# Patient Record
Sex: Male | Born: 1960
Health system: Southern US, Community
[De-identification: ages and names within clinical notes are randomized; demographics above are authoritative.]

## PROBLEM LIST (undated history)

## (undated) DIAGNOSIS — I2510 Atherosclerotic heart disease of native coronary artery without angina pectoris: Secondary | ICD-10-CM

## (undated) DIAGNOSIS — E119 Type 2 diabetes mellitus without complications: Secondary | ICD-10-CM

## (undated) DIAGNOSIS — G709 Myoneural disorder, unspecified: Secondary | ICD-10-CM

## (undated) DIAGNOSIS — E785 Hyperlipidemia, unspecified: Secondary | ICD-10-CM

## (undated) DIAGNOSIS — J189 Pneumonia, unspecified organism: Secondary | ICD-10-CM

## (undated) DIAGNOSIS — I10 Essential (primary) hypertension: Secondary | ICD-10-CM

## (undated) HISTORY — PX: CLEFT PALATE REPAIR: SUR1165

## (undated) HISTORY — DX: Hyperlipidemia, unspecified: E78.5

## (undated) HISTORY — DX: Type 2 diabetes mellitus without complications: E11.9

## (undated) HISTORY — DX: Essential (primary) hypertension: I10

---

## 2015-09-13 ENCOUNTER — Encounter: Payer: Self-pay | Admitting: *Deleted

## 2015-09-13 ENCOUNTER — Encounter: Payer: BLUE CROSS/BLUE SHIELD | Attending: Internal Medicine | Admitting: *Deleted

## 2015-09-13 VITALS — Ht 70.0 in | Wt 174.0 lb

## 2015-09-13 DIAGNOSIS — E1065 Type 1 diabetes mellitus with hyperglycemia: Secondary | ICD-10-CM | POA: Diagnosis present

## 2015-09-13 DIAGNOSIS — IMO0002 Reserved for concepts with insufficient information to code with codable children: Secondary | ICD-10-CM

## 2015-09-13 DIAGNOSIS — E108 Type 1 diabetes mellitus with unspecified complications: Secondary | ICD-10-CM

## 2015-09-13 NOTE — Progress Notes (Signed)
Insulin Pump Evaluation Visit:  Date: 09/13/2015   Appt start time: 1400 end time:  1530.  Assessment:  This patient has DM 1 and their primary concerns today: moving from injections to insulin pump and CGM.   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[employer] and the schedule is variable  Last A1c was 7.6%  Patient states they forget to take their insulin injection on average 0 times per week Patient states they have had hypoglycemia 10-15 times in the past month Patient states their biggest barrier with diabetes is glycemic control  Progress Towards Obtaining an Insulin Pump Goal(s):   Patient states their expectations of pump therapy include: better glycemic control and greater flexibility with life Patient expresses understanding that for improved outcomes for their diabetes on an insulin pump they will:  During the first 1-2 weeks (or more) of new pump use, the patient will test BG pre and 2 hour post each meal, bedtime and 3 AM to assist the provider in evaluating accuracy of pump settings and to prevent DKA   Will test BG at least 4 times per day once evaluation time is completed  Change out pump infusion set at least every 3 days  Upload pump information to their pump's Web Based Program on a regular basis so provider can assess patterns and make setting adjustments.     Intervention:    Taught difference between delivery of insulin via syringe/pen compared to insulin pump.  Demonstrated improved insulin delivery via pump due to  improved accuracy of dose and flexibility of adjusting bolus insulin based on carb intake and BG correction.  Showed patient the following pumps:  Medtronic 630G per pateint request  Demonstrated pump, insulin reservoir and infusion set options, and button pushing for bolus delivery of insulin through the pump  Explained importance of testing BG at least 4 times per day for appropriate correction of high BG and prevention of DKA as applicable.  Emphasized importance of follow up after Pump Start for appropriate pump setting adjustments and on-going training on more advanced features.  Discussed current Continuous Glucose Monitoring options (if needed)  Handouts given during visit include:  Introduction to Pump Therapy Handout  DM 1 Support Group Flyer  Monitoring/Evaluation:    Patient does want to continue with pursuit of Medtronic 630 G  insulin pump and CGM.  We have faxed the AOB paperwork local Pump Company Rep so they can start the process of obtaining the pump. Contact information provided to the patient.  Patient instructed to go to that pump web-site to complete any learning module on insulin pump prior to next visit  Once pump is shipped, patient to call this office to set up training.

## 2015-09-13 NOTE — Patient Instructions (Signed)
Plan: You have decided to move forward with the Medtronic 630G pump and Continuous Glucose Sensor We will fax the AOB and other PPW to Roxanne, she will reach out to you shortly When your pump ships, call this office to set up pump start training, then we well schedule the CGM training after that

## 2015-09-17 NOTE — Progress Notes (Signed)
We also spent a few minutes reviewing Carb Counting including by Food Group. Patient comfortable with reading food labels and now expresses good understanding of identifying foods by food group and converting those servngs to grams of carbohydrate when labels are not available.

## 2015-10-10 ENCOUNTER — Encounter: Payer: BLUE CROSS/BLUE SHIELD | Attending: Internal Medicine | Admitting: *Deleted

## 2015-10-10 DIAGNOSIS — E1065 Type 1 diabetes mellitus with hyperglycemia: Secondary | ICD-10-CM | POA: Insufficient documentation

## 2015-10-10 DIAGNOSIS — E109 Type 1 diabetes mellitus without complications: Secondary | ICD-10-CM

## 2015-10-13 NOTE — Progress Notes (Signed)
Insulin Pump Start Progress Note on 10/10/15  Patient appointment start time: 1400  End time 1600  Patient here for insulin pump start on Medtronic 630G pump and Mio infusion set Orders with pump settings received from MD Patient completed Pre- training by training books, return demonstration  Reviewed Pump Set Up including  Menu Settings  Bolus Settings:    Insulin Carb Ratio of 1 unit / 12 grams     Insulin Sensitivity Factor of 1 unit / 40 mg/dl              Target: 161100 mg/dl  Suspend  Basal with initial Basal Rate of 1.0 units/hour  Reservoir Set Up  Utilities  Pump Training Checklist completed  Used Temp Basal of 9 hours duration @ 50 % basal due to patient taking their long acting insulin yesterday at 11 PM  Patient is aware to sign up for Web Based Software and agrees to upload within 3 days for review of progress and allow for pump setting adjustments.  Patient successfully completed pump start and instructed to call me if BG drops below 70 mg/dl or goes above 096300 mg/dl or as directed by MD  Follow up plan: 1 week follow up

## 2015-10-19 ENCOUNTER — Encounter: Payer: BLUE CROSS/BLUE SHIELD | Admitting: *Deleted

## 2015-10-23 DIAGNOSIS — E1042 Type 1 diabetes mellitus with diabetic polyneuropathy: Secondary | ICD-10-CM | POA: Diagnosis not present

## 2015-10-23 DIAGNOSIS — Z794 Long term (current) use of insulin: Secondary | ICD-10-CM | POA: Diagnosis not present

## 2015-10-23 DIAGNOSIS — E103511 Type 1 diabetes mellitus with proliferative diabetic retinopathy with macular edema, right eye: Secondary | ICD-10-CM | POA: Diagnosis not present

## 2015-10-25 ENCOUNTER — Encounter: Payer: BLUE CROSS/BLUE SHIELD | Admitting: *Deleted

## 2015-10-25 DIAGNOSIS — Z1211 Encounter for screening for malignant neoplasm of colon: Secondary | ICD-10-CM | POA: Diagnosis not present

## 2015-10-25 DIAGNOSIS — E109 Type 1 diabetes mellitus without complications: Secondary | ICD-10-CM

## 2015-11-10 NOTE — Progress Notes (Signed)
CGM Training:  Appt start time:1130 end time:1230.  Assessment:  Primary concerns today: Patient here for initiation of Medtronic Continuous Glucose Monitoring.   Medications: see list, new on insulin pump and very pleased with it     Intervention:   Understanding Glucose Sensing Programming Sensor Information  High Glucose: not set up but discussed  Low Glucose 80 mg/dl  Other settings to be added at follow up visit Starting [employer] Sensor Use of Product  Entering BG, Calibration Technique and Graphs with Public librarianArrows [employer] and Reports Basic Care Troubleshooting Site and Alarms   Follow Up Patient to see MD for insulin dose adjustments and to schedule visit with me for CGM follow up within 1-2 week(s).

## 2015-12-05 DIAGNOSIS — E113591 Type 2 diabetes mellitus with proliferative diabetic retinopathy without macular edema, right eye: Secondary | ICD-10-CM | POA: Diagnosis not present

## 2015-12-20 DIAGNOSIS — E109 Type 1 diabetes mellitus without complications: Secondary | ICD-10-CM | POA: Diagnosis not present

## 2015-12-25 DIAGNOSIS — E109 Type 1 diabetes mellitus without complications: Secondary | ICD-10-CM | POA: Diagnosis not present

## 2016-01-02 DIAGNOSIS — Z794 Long term (current) use of insulin: Secondary | ICD-10-CM | POA: Diagnosis not present

## 2016-01-02 DIAGNOSIS — E1042 Type 1 diabetes mellitus with diabetic polyneuropathy: Secondary | ICD-10-CM | POA: Diagnosis not present

## 2016-01-02 DIAGNOSIS — E109 Type 1 diabetes mellitus without complications: Secondary | ICD-10-CM | POA: Diagnosis not present

## 2016-01-02 DIAGNOSIS — E103511 Type 1 diabetes mellitus with proliferative diabetic retinopathy with macular edema, right eye: Secondary | ICD-10-CM | POA: Diagnosis not present

## 2016-01-02 DIAGNOSIS — E1065 Type 1 diabetes mellitus with hyperglycemia: Secondary | ICD-10-CM | POA: Diagnosis not present

## 2016-01-23 ENCOUNTER — Ambulatory Visit: Payer: BLUE CROSS/BLUE SHIELD | Admitting: *Deleted

## 2016-02-13 DIAGNOSIS — H25013 Cortical age-related cataract, bilateral: Secondary | ICD-10-CM | POA: Diagnosis not present

## 2016-02-13 DIAGNOSIS — H524 Presbyopia: Secondary | ICD-10-CM | POA: Diagnosis not present

## 2016-02-13 DIAGNOSIS — H40012 Open angle with borderline findings, low risk, left eye: Secondary | ICD-10-CM | POA: Diagnosis not present

## 2016-02-13 DIAGNOSIS — H40011 Open angle with borderline findings, low risk, right eye: Secondary | ICD-10-CM | POA: Diagnosis not present

## 2016-02-14 DIAGNOSIS — E103311 Type 1 diabetes mellitus with moderate nonproliferative diabetic retinopathy with macular edema, right eye: Secondary | ICD-10-CM | POA: Diagnosis not present

## 2016-02-15 DIAGNOSIS — Z794 Long term (current) use of insulin: Secondary | ICD-10-CM | POA: Diagnosis not present

## 2016-02-15 DIAGNOSIS — E109 Type 1 diabetes mellitus without complications: Secondary | ICD-10-CM | POA: Diagnosis not present

## 2016-02-27 DIAGNOSIS — Z794 Long term (current) use of insulin: Secondary | ICD-10-CM | POA: Diagnosis not present

## 2016-02-27 DIAGNOSIS — E103511 Type 1 diabetes mellitus with proliferative diabetic retinopathy with macular edema, right eye: Secondary | ICD-10-CM | POA: Diagnosis not present

## 2016-02-27 DIAGNOSIS — E1065 Type 1 diabetes mellitus with hyperglycemia: Secondary | ICD-10-CM | POA: Diagnosis not present

## 2016-02-27 DIAGNOSIS — E1042 Type 1 diabetes mellitus with diabetic polyneuropathy: Secondary | ICD-10-CM | POA: Diagnosis not present

## 2016-03-13 DIAGNOSIS — E109 Type 1 diabetes mellitus without complications: Secondary | ICD-10-CM | POA: Diagnosis not present

## 2016-03-20 DIAGNOSIS — E109 Type 1 diabetes mellitus without complications: Secondary | ICD-10-CM | POA: Diagnosis not present

## 2016-03-20 DIAGNOSIS — Z794 Long term (current) use of insulin: Secondary | ICD-10-CM | POA: Diagnosis not present

## 2016-03-27 DIAGNOSIS — Z23 Encounter for immunization: Secondary | ICD-10-CM | POA: Diagnosis not present

## 2016-03-27 DIAGNOSIS — E1042 Type 1 diabetes mellitus with diabetic polyneuropathy: Secondary | ICD-10-CM | POA: Diagnosis not present

## 2016-03-27 DIAGNOSIS — Z125 Encounter for screening for malignant neoplasm of prostate: Secondary | ICD-10-CM | POA: Diagnosis not present

## 2016-03-27 DIAGNOSIS — E785 Hyperlipidemia, unspecified: Secondary | ICD-10-CM | POA: Diagnosis not present

## 2016-03-27 DIAGNOSIS — H409 Unspecified glaucoma: Secondary | ICD-10-CM | POA: Diagnosis not present

## 2016-03-27 DIAGNOSIS — Z1159 Encounter for screening for other viral diseases: Secondary | ICD-10-CM | POA: Diagnosis not present

## 2016-03-27 DIAGNOSIS — E1065 Type 1 diabetes mellitus with hyperglycemia: Secondary | ICD-10-CM | POA: Diagnosis not present

## 2016-03-28 DIAGNOSIS — E109 Type 1 diabetes mellitus without complications: Secondary | ICD-10-CM | POA: Diagnosis not present

## 2016-04-10 DIAGNOSIS — E113511 Type 2 diabetes mellitus with proliferative diabetic retinopathy with macular edema, right eye: Secondary | ICD-10-CM | POA: Diagnosis not present

## 2016-05-21 DIAGNOSIS — E109 Type 1 diabetes mellitus without complications: Secondary | ICD-10-CM | POA: Diagnosis not present

## 2016-05-21 DIAGNOSIS — Z794 Long term (current) use of insulin: Secondary | ICD-10-CM | POA: Diagnosis not present

## 2016-06-03 DIAGNOSIS — E1042 Type 1 diabetes mellitus with diabetic polyneuropathy: Secondary | ICD-10-CM | POA: Diagnosis not present

## 2016-06-03 DIAGNOSIS — Z794 Long term (current) use of insulin: Secondary | ICD-10-CM | POA: Diagnosis not present

## 2016-06-03 DIAGNOSIS — E103511 Type 1 diabetes mellitus with proliferative diabetic retinopathy with macular edema, right eye: Secondary | ICD-10-CM | POA: Diagnosis not present

## 2016-06-05 DIAGNOSIS — E113511 Type 2 diabetes mellitus with proliferative diabetic retinopathy with macular edema, right eye: Secondary | ICD-10-CM | POA: Diagnosis not present

## 2016-06-07 DIAGNOSIS — E109 Type 1 diabetes mellitus without complications: Secondary | ICD-10-CM | POA: Diagnosis not present

## 2016-07-02 DIAGNOSIS — E109 Type 1 diabetes mellitus without complications: Secondary | ICD-10-CM | POA: Diagnosis not present

## 2016-07-31 DIAGNOSIS — E113511 Type 2 diabetes mellitus with proliferative diabetic retinopathy with macular edema, right eye: Secondary | ICD-10-CM | POA: Diagnosis not present

## 2016-08-26 DIAGNOSIS — E109 Type 1 diabetes mellitus without complications: Secondary | ICD-10-CM | POA: Diagnosis not present

## 2016-08-26 DIAGNOSIS — Z794 Long term (current) use of insulin: Secondary | ICD-10-CM | POA: Diagnosis not present

## 2016-08-27 DIAGNOSIS — H40013 Open angle with borderline findings, low risk, bilateral: Secondary | ICD-10-CM | POA: Diagnosis not present

## 2016-09-06 DIAGNOSIS — E109 Type 1 diabetes mellitus without complications: Secondary | ICD-10-CM | POA: Diagnosis not present

## 2016-09-10 DIAGNOSIS — Z794 Long term (current) use of insulin: Secondary | ICD-10-CM | POA: Diagnosis not present

## 2016-09-10 DIAGNOSIS — E103511 Type 1 diabetes mellitus with proliferative diabetic retinopathy with macular edema, right eye: Secondary | ICD-10-CM | POA: Diagnosis not present

## 2016-09-10 DIAGNOSIS — E1042 Type 1 diabetes mellitus with diabetic polyneuropathy: Secondary | ICD-10-CM | POA: Diagnosis not present

## 2016-10-02 DIAGNOSIS — E113512 Type 2 diabetes mellitus with proliferative diabetic retinopathy with macular edema, left eye: Secondary | ICD-10-CM | POA: Diagnosis not present

## 2016-10-02 DIAGNOSIS — E113511 Type 2 diabetes mellitus with proliferative diabetic retinopathy with macular edema, right eye: Secondary | ICD-10-CM | POA: Diagnosis not present

## 2016-10-17 DIAGNOSIS — E109 Type 1 diabetes mellitus without complications: Secondary | ICD-10-CM | POA: Diagnosis not present

## 2016-11-13 DIAGNOSIS — E113511 Type 2 diabetes mellitus with proliferative diabetic retinopathy with macular edema, right eye: Secondary | ICD-10-CM | POA: Diagnosis not present

## 2016-11-25 DIAGNOSIS — E109 Type 1 diabetes mellitus without complications: Secondary | ICD-10-CM | POA: Diagnosis not present

## 2016-11-25 DIAGNOSIS — Z794 Long term (current) use of insulin: Secondary | ICD-10-CM | POA: Diagnosis not present

## 2016-12-10 DIAGNOSIS — E109 Type 1 diabetes mellitus without complications: Secondary | ICD-10-CM | POA: Diagnosis not present

## 2017-01-15 DIAGNOSIS — E113511 Type 2 diabetes mellitus with proliferative diabetic retinopathy with macular edema, right eye: Secondary | ICD-10-CM | POA: Diagnosis not present

## 2017-01-27 DIAGNOSIS — E109 Type 1 diabetes mellitus without complications: Secondary | ICD-10-CM | POA: Diagnosis not present

## 2017-02-24 DIAGNOSIS — E113511 Type 2 diabetes mellitus with proliferative diabetic retinopathy with macular edema, right eye: Secondary | ICD-10-CM | POA: Diagnosis not present

## 2017-02-24 DIAGNOSIS — H2513 Age-related nuclear cataract, bilateral: Secondary | ICD-10-CM | POA: Diagnosis not present

## 2017-02-24 DIAGNOSIS — H5213 Myopia, bilateral: Secondary | ICD-10-CM | POA: Diagnosis not present

## 2017-02-24 DIAGNOSIS — H40013 Open angle with borderline findings, low risk, bilateral: Secondary | ICD-10-CM | POA: Diagnosis not present

## 2017-02-27 DIAGNOSIS — E113511 Type 2 diabetes mellitus with proliferative diabetic retinopathy with macular edema, right eye: Secondary | ICD-10-CM | POA: Diagnosis not present

## 2017-03-03 DIAGNOSIS — E1042 Type 1 diabetes mellitus with diabetic polyneuropathy: Secondary | ICD-10-CM | POA: Diagnosis not present

## 2017-03-03 DIAGNOSIS — Z794 Long term (current) use of insulin: Secondary | ICD-10-CM | POA: Diagnosis not present

## 2017-03-03 DIAGNOSIS — H8110 Benign paroxysmal vertigo, unspecified ear: Secondary | ICD-10-CM | POA: Diagnosis not present

## 2017-03-03 DIAGNOSIS — E103511 Type 1 diabetes mellitus with proliferative diabetic retinopathy with macular edema, right eye: Secondary | ICD-10-CM | POA: Diagnosis not present

## 2017-03-18 ENCOUNTER — Ambulatory Visit: Payer: BLUE CROSS/BLUE SHIELD

## 2017-03-20 ENCOUNTER — Ambulatory Visit: Payer: BLUE CROSS/BLUE SHIELD | Admitting: Physical Therapy

## 2017-03-24 ENCOUNTER — Ambulatory Visit: Payer: BLUE CROSS/BLUE SHIELD | Admitting: Physical Therapy

## 2017-03-26 DIAGNOSIS — E1042 Type 1 diabetes mellitus with diabetic polyneuropathy: Secondary | ICD-10-CM | POA: Diagnosis not present

## 2017-03-26 DIAGNOSIS — Z23 Encounter for immunization: Secondary | ICD-10-CM | POA: Diagnosis not present

## 2017-03-26 DIAGNOSIS — I10 Essential (primary) hypertension: Secondary | ICD-10-CM | POA: Diagnosis not present

## 2017-03-26 DIAGNOSIS — E785 Hyperlipidemia, unspecified: Secondary | ICD-10-CM | POA: Diagnosis not present

## 2017-03-28 DIAGNOSIS — E109 Type 1 diabetes mellitus without complications: Secondary | ICD-10-CM | POA: Diagnosis not present

## 2017-03-28 DIAGNOSIS — Z794 Long term (current) use of insulin: Secondary | ICD-10-CM | POA: Diagnosis not present

## 2017-04-18 DIAGNOSIS — M65341 Trigger finger, right ring finger: Secondary | ICD-10-CM | POA: Diagnosis not present

## 2017-04-18 DIAGNOSIS — M65332 Trigger finger, left middle finger: Secondary | ICD-10-CM | POA: Diagnosis not present

## 2017-04-18 DIAGNOSIS — M65331 Trigger finger, right middle finger: Secondary | ICD-10-CM | POA: Diagnosis not present

## 2017-04-18 DIAGNOSIS — M65321 Trigger finger, right index finger: Secondary | ICD-10-CM | POA: Diagnosis not present

## 2017-04-21 DIAGNOSIS — E109 Type 1 diabetes mellitus without complications: Secondary | ICD-10-CM | POA: Diagnosis not present

## 2017-04-30 DIAGNOSIS — E113511 Type 2 diabetes mellitus with proliferative diabetic retinopathy with macular edema, right eye: Secondary | ICD-10-CM | POA: Diagnosis not present

## 2017-05-16 DIAGNOSIS — E109 Type 1 diabetes mellitus without complications: Secondary | ICD-10-CM | POA: Diagnosis not present

## 2017-06-10 DIAGNOSIS — T501X5A Adverse effect of loop [high-ceiling] diuretics, initial encounter: Secondary | ICD-10-CM | POA: Diagnosis not present

## 2017-06-10 DIAGNOSIS — K529 Noninfective gastroenteritis and colitis, unspecified: Secondary | ICD-10-CM | POA: Diagnosis not present

## 2017-06-10 DIAGNOSIS — E861 Hypovolemia: Secondary | ICD-10-CM | POA: Diagnosis not present

## 2017-06-10 DIAGNOSIS — J90 Pleural effusion, not elsewhere classified: Secondary | ICD-10-CM | POA: Diagnosis not present

## 2017-06-10 DIAGNOSIS — A419 Sepsis, unspecified organism: Secondary | ICD-10-CM | POA: Diagnosis not present

## 2017-06-10 DIAGNOSIS — E860 Dehydration: Secondary | ICD-10-CM | POA: Diagnosis not present

## 2017-06-10 DIAGNOSIS — T368X5A Adverse effect of other systemic antibiotics, initial encounter: Secondary | ICD-10-CM | POA: Diagnosis not present

## 2017-06-10 DIAGNOSIS — Z9981 Dependence on supplemental oxygen: Secondary | ICD-10-CM | POA: Diagnosis not present

## 2017-06-10 DIAGNOSIS — Z7409 Other reduced mobility: Secondary | ICD-10-CM | POA: Diagnosis not present

## 2017-06-10 DIAGNOSIS — J189 Pneumonia, unspecified organism: Secondary | ICD-10-CM | POA: Diagnosis not present

## 2017-06-10 DIAGNOSIS — Z9641 Presence of insulin pump (external) (internal): Secondary | ICD-10-CM | POA: Diagnosis not present

## 2017-06-10 DIAGNOSIS — E871 Hypo-osmolality and hyponatremia: Secondary | ICD-10-CM | POA: Diagnosis not present

## 2017-06-10 DIAGNOSIS — R55 Syncope and collapse: Secondary | ICD-10-CM | POA: Diagnosis not present

## 2017-06-10 DIAGNOSIS — R5381 Other malaise: Secondary | ICD-10-CM | POA: Diagnosis not present

## 2017-06-10 DIAGNOSIS — R0682 Tachypnea, not elsewhere classified: Secondary | ICD-10-CM | POA: Diagnosis not present

## 2017-06-10 DIAGNOSIS — R112 Nausea with vomiting, unspecified: Secondary | ICD-10-CM | POA: Diagnosis not present

## 2017-06-10 DIAGNOSIS — H9109 Ototoxic hearing loss, unspecified ear: Secondary | ICD-10-CM | POA: Diagnosis not present

## 2017-06-10 DIAGNOSIS — R918 Other nonspecific abnormal finding of lung field: Secondary | ICD-10-CM | POA: Diagnosis not present

## 2017-06-10 DIAGNOSIS — I5030 Unspecified diastolic (congestive) heart failure: Secondary | ICD-10-CM | POA: Diagnosis not present

## 2017-06-10 DIAGNOSIS — N179 Acute kidney failure, unspecified: Secondary | ICD-10-CM | POA: Diagnosis not present

## 2017-06-10 DIAGNOSIS — I110 Hypertensive heart disease with heart failure: Secondary | ICD-10-CM | POA: Diagnosis not present

## 2017-06-10 DIAGNOSIS — M6281 Muscle weakness (generalized): Secondary | ICD-10-CM | POA: Diagnosis not present

## 2017-06-10 DIAGNOSIS — J9601 Acute respiratory failure with hypoxia: Secondary | ICD-10-CM | POA: Diagnosis not present

## 2017-06-10 DIAGNOSIS — R197 Diarrhea, unspecified: Secondary | ICD-10-CM | POA: Diagnosis not present

## 2017-06-10 DIAGNOSIS — Y95 Nosocomial condition: Secondary | ICD-10-CM | POA: Diagnosis not present

## 2017-06-10 DIAGNOSIS — E785 Hyperlipidemia, unspecified: Secondary | ICD-10-CM | POA: Diagnosis not present

## 2017-06-10 DIAGNOSIS — R000 Tachycardia, unspecified: Secondary | ICD-10-CM | POA: Diagnosis not present

## 2017-06-10 DIAGNOSIS — H9190 Unspecified hearing loss, unspecified ear: Secondary | ICD-10-CM | POA: Diagnosis not present

## 2017-06-10 DIAGNOSIS — D72829 Elevated white blood cell count, unspecified: Secondary | ICD-10-CM | POA: Diagnosis not present

## 2017-06-10 DIAGNOSIS — J690 Pneumonitis due to inhalation of food and vomit: Secondary | ICD-10-CM | POA: Diagnosis not present

## 2017-06-10 DIAGNOSIS — R6520 Severe sepsis without septic shock: Secondary | ICD-10-CM | POA: Diagnosis not present

## 2017-06-10 DIAGNOSIS — R0603 Acute respiratory distress: Secondary | ICD-10-CM | POA: Diagnosis not present

## 2017-06-10 DIAGNOSIS — E109 Type 1 diabetes mellitus without complications: Secondary | ICD-10-CM | POA: Diagnosis not present

## 2017-06-12 DIAGNOSIS — K529 Noninfective gastroenteritis and colitis, unspecified: Secondary | ICD-10-CM | POA: Diagnosis not present

## 2017-06-12 DIAGNOSIS — J690 Pneumonitis due to inhalation of food and vomit: Secondary | ICD-10-CM | POA: Diagnosis not present

## 2017-06-12 DIAGNOSIS — R0603 Acute respiratory distress: Secondary | ICD-10-CM | POA: Diagnosis not present

## 2017-06-12 DIAGNOSIS — R6520 Severe sepsis without septic shock: Secondary | ICD-10-CM | POA: Diagnosis not present

## 2017-06-12 DIAGNOSIS — R55 Syncope and collapse: Secondary | ICD-10-CM | POA: Diagnosis not present

## 2017-06-12 DIAGNOSIS — A419 Sepsis, unspecified organism: Secondary | ICD-10-CM | POA: Diagnosis not present

## 2017-06-12 DIAGNOSIS — N179 Acute kidney failure, unspecified: Secondary | ICD-10-CM | POA: Diagnosis not present

## 2017-06-13 DIAGNOSIS — N179 Acute kidney failure, unspecified: Secondary | ICD-10-CM | POA: Diagnosis not present

## 2017-06-13 DIAGNOSIS — K529 Noninfective gastroenteritis and colitis, unspecified: Secondary | ICD-10-CM | POA: Diagnosis not present

## 2017-06-13 DIAGNOSIS — R6520 Severe sepsis without septic shock: Secondary | ICD-10-CM | POA: Diagnosis not present

## 2017-06-13 DIAGNOSIS — A419 Sepsis, unspecified organism: Secondary | ICD-10-CM | POA: Diagnosis not present

## 2017-06-14 DIAGNOSIS — R6520 Severe sepsis without septic shock: Secondary | ICD-10-CM | POA: Diagnosis not present

## 2017-06-14 DIAGNOSIS — N179 Acute kidney failure, unspecified: Secondary | ICD-10-CM | POA: Diagnosis not present

## 2017-06-14 DIAGNOSIS — A419 Sepsis, unspecified organism: Secondary | ICD-10-CM | POA: Diagnosis not present

## 2017-06-14 DIAGNOSIS — K529 Noninfective gastroenteritis and colitis, unspecified: Secondary | ICD-10-CM | POA: Diagnosis not present

## 2017-06-15 DIAGNOSIS — A419 Sepsis, unspecified organism: Secondary | ICD-10-CM | POA: Diagnosis not present

## 2017-06-15 DIAGNOSIS — R0682 Tachypnea, not elsewhere classified: Secondary | ICD-10-CM | POA: Diagnosis not present

## 2017-06-15 DIAGNOSIS — R918 Other nonspecific abnormal finding of lung field: Secondary | ICD-10-CM | POA: Diagnosis not present

## 2017-06-15 DIAGNOSIS — R0603 Acute respiratory distress: Secondary | ICD-10-CM | POA: Diagnosis not present

## 2017-06-15 DIAGNOSIS — J90 Pleural effusion, not elsewhere classified: Secondary | ICD-10-CM | POA: Diagnosis not present

## 2017-06-15 DIAGNOSIS — J690 Pneumonitis due to inhalation of food and vomit: Secondary | ICD-10-CM | POA: Diagnosis not present

## 2017-06-16 DIAGNOSIS — J189 Pneumonia, unspecified organism: Secondary | ICD-10-CM | POA: Diagnosis not present

## 2017-06-16 DIAGNOSIS — E109 Type 1 diabetes mellitus without complications: Secondary | ICD-10-CM | POA: Diagnosis not present

## 2017-06-16 DIAGNOSIS — Y95 Nosocomial condition: Secondary | ICD-10-CM | POA: Diagnosis not present

## 2017-06-16 DIAGNOSIS — A419 Sepsis, unspecified organism: Secondary | ICD-10-CM | POA: Diagnosis not present

## 2017-06-17 DIAGNOSIS — E109 Type 1 diabetes mellitus without complications: Secondary | ICD-10-CM | POA: Diagnosis not present

## 2017-06-17 DIAGNOSIS — E871 Hypo-osmolality and hyponatremia: Secondary | ICD-10-CM | POA: Diagnosis not present

## 2017-06-17 DIAGNOSIS — A419 Sepsis, unspecified organism: Secondary | ICD-10-CM | POA: Diagnosis not present

## 2017-06-17 DIAGNOSIS — J690 Pneumonitis due to inhalation of food and vomit: Secondary | ICD-10-CM | POA: Diagnosis not present

## 2017-06-17 DIAGNOSIS — J189 Pneumonia, unspecified organism: Secondary | ICD-10-CM | POA: Diagnosis not present

## 2017-06-17 DIAGNOSIS — Y95 Nosocomial condition: Secondary | ICD-10-CM | POA: Diagnosis not present

## 2017-06-18 DIAGNOSIS — A419 Sepsis, unspecified organism: Secondary | ICD-10-CM | POA: Diagnosis not present

## 2017-06-18 DIAGNOSIS — Y95 Nosocomial condition: Secondary | ICD-10-CM | POA: Diagnosis not present

## 2017-06-18 DIAGNOSIS — J189 Pneumonia, unspecified organism: Secondary | ICD-10-CM | POA: Diagnosis not present

## 2017-06-18 DIAGNOSIS — J9601 Acute respiratory failure with hypoxia: Secondary | ICD-10-CM | POA: Diagnosis not present

## 2017-06-18 DIAGNOSIS — E871 Hypo-osmolality and hyponatremia: Secondary | ICD-10-CM | POA: Diagnosis not present

## 2017-06-18 DIAGNOSIS — E861 Hypovolemia: Secondary | ICD-10-CM | POA: Diagnosis not present

## 2017-06-19 DIAGNOSIS — E785 Hyperlipidemia, unspecified: Secondary | ICD-10-CM | POA: Diagnosis not present

## 2017-06-19 DIAGNOSIS — N179 Acute kidney failure, unspecified: Secondary | ICD-10-CM | POA: Diagnosis not present

## 2017-06-19 DIAGNOSIS — J189 Pneumonia, unspecified organism: Secondary | ICD-10-CM | POA: Diagnosis not present

## 2017-06-19 DIAGNOSIS — J9601 Acute respiratory failure with hypoxia: Secondary | ICD-10-CM | POA: Diagnosis not present

## 2017-06-19 DIAGNOSIS — D72829 Elevated white blood cell count, unspecified: Secondary | ICD-10-CM | POA: Diagnosis not present

## 2017-06-19 DIAGNOSIS — R0603 Acute respiratory distress: Secondary | ICD-10-CM | POA: Diagnosis not present

## 2017-06-19 DIAGNOSIS — R000 Tachycardia, unspecified: Secondary | ICD-10-CM | POA: Diagnosis not present

## 2017-06-20 DIAGNOSIS — E785 Hyperlipidemia, unspecified: Secondary | ICD-10-CM | POA: Diagnosis not present

## 2017-06-20 DIAGNOSIS — J189 Pneumonia, unspecified organism: Secondary | ICD-10-CM | POA: Diagnosis not present

## 2017-06-20 DIAGNOSIS — J9601 Acute respiratory failure with hypoxia: Secondary | ICD-10-CM | POA: Diagnosis not present

## 2017-06-20 DIAGNOSIS — R000 Tachycardia, unspecified: Secondary | ICD-10-CM | POA: Diagnosis not present

## 2017-06-20 DIAGNOSIS — N179 Acute kidney failure, unspecified: Secondary | ICD-10-CM | POA: Diagnosis not present

## 2017-06-20 DIAGNOSIS — Y95 Nosocomial condition: Secondary | ICD-10-CM | POA: Diagnosis not present

## 2017-06-21 DIAGNOSIS — H9190 Unspecified hearing loss, unspecified ear: Secondary | ICD-10-CM | POA: Diagnosis not present

## 2017-06-21 DIAGNOSIS — Z9981 Dependence on supplemental oxygen: Secondary | ICD-10-CM | POA: Diagnosis not present

## 2017-06-21 DIAGNOSIS — E871 Hypo-osmolality and hyponatremia: Secondary | ICD-10-CM | POA: Diagnosis not present

## 2017-06-21 DIAGNOSIS — J9601 Acute respiratory failure with hypoxia: Secondary | ICD-10-CM | POA: Diagnosis not present

## 2017-06-21 DIAGNOSIS — Y95 Nosocomial condition: Secondary | ICD-10-CM | POA: Diagnosis not present

## 2017-06-21 DIAGNOSIS — J189 Pneumonia, unspecified organism: Secondary | ICD-10-CM | POA: Diagnosis not present

## 2017-06-22 DIAGNOSIS — E109 Type 1 diabetes mellitus without complications: Secondary | ICD-10-CM | POA: Diagnosis not present

## 2017-06-22 DIAGNOSIS — I110 Hypertensive heart disease with heart failure: Secondary | ICD-10-CM | POA: Diagnosis not present

## 2017-06-22 DIAGNOSIS — Z7409 Other reduced mobility: Secondary | ICD-10-CM | POA: Diagnosis not present

## 2017-06-22 DIAGNOSIS — M6281 Muscle weakness (generalized): Secondary | ICD-10-CM | POA: Diagnosis not present

## 2017-06-24 DIAGNOSIS — Z7982 Long term (current) use of aspirin: Secondary | ICD-10-CM | POA: Diagnosis not present

## 2017-06-24 DIAGNOSIS — Z9641 Presence of insulin pump (external) (internal): Secondary | ICD-10-CM | POA: Diagnosis not present

## 2017-06-24 DIAGNOSIS — E119 Type 2 diabetes mellitus without complications: Secondary | ICD-10-CM | POA: Diagnosis not present

## 2017-06-24 DIAGNOSIS — I10 Essential (primary) hypertension: Secondary | ICD-10-CM | POA: Diagnosis not present

## 2017-06-24 DIAGNOSIS — Z794 Long term (current) use of insulin: Secondary | ICD-10-CM | POA: Diagnosis not present

## 2017-06-25 DIAGNOSIS — Z794 Long term (current) use of insulin: Secondary | ICD-10-CM | POA: Diagnosis not present

## 2017-06-25 DIAGNOSIS — E109 Type 1 diabetes mellitus without complications: Secondary | ICD-10-CM | POA: Diagnosis not present

## 2017-06-27 DIAGNOSIS — Z794 Long term (current) use of insulin: Secondary | ICD-10-CM | POA: Diagnosis not present

## 2017-06-27 DIAGNOSIS — Z7982 Long term (current) use of aspirin: Secondary | ICD-10-CM | POA: Diagnosis not present

## 2017-06-27 DIAGNOSIS — E119 Type 2 diabetes mellitus without complications: Secondary | ICD-10-CM | POA: Diagnosis not present

## 2017-06-27 DIAGNOSIS — I10 Essential (primary) hypertension: Secondary | ICD-10-CM | POA: Diagnosis not present

## 2017-06-27 DIAGNOSIS — Z9641 Presence of insulin pump (external) (internal): Secondary | ICD-10-CM | POA: Diagnosis not present

## 2017-07-02 DIAGNOSIS — Z794 Long term (current) use of insulin: Secondary | ICD-10-CM | POA: Diagnosis not present

## 2017-07-02 DIAGNOSIS — E119 Type 2 diabetes mellitus without complications: Secondary | ICD-10-CM | POA: Diagnosis not present

## 2017-07-02 DIAGNOSIS — Z9641 Presence of insulin pump (external) (internal): Secondary | ICD-10-CM | POA: Diagnosis not present

## 2017-07-02 DIAGNOSIS — I10 Essential (primary) hypertension: Secondary | ICD-10-CM | POA: Diagnosis not present

## 2017-07-02 DIAGNOSIS — Z7982 Long term (current) use of aspirin: Secondary | ICD-10-CM | POA: Diagnosis not present

## 2017-07-03 DIAGNOSIS — Z794 Long term (current) use of insulin: Secondary | ICD-10-CM | POA: Diagnosis not present

## 2017-07-03 DIAGNOSIS — E119 Type 2 diabetes mellitus without complications: Secondary | ICD-10-CM | POA: Diagnosis not present

## 2017-07-03 DIAGNOSIS — I10 Essential (primary) hypertension: Secondary | ICD-10-CM | POA: Diagnosis not present

## 2017-07-03 DIAGNOSIS — Z9641 Presence of insulin pump (external) (internal): Secondary | ICD-10-CM | POA: Diagnosis not present

## 2017-07-03 DIAGNOSIS — Z7982 Long term (current) use of aspirin: Secondary | ICD-10-CM | POA: Diagnosis not present

## 2017-07-14 DIAGNOSIS — E103511 Type 1 diabetes mellitus with proliferative diabetic retinopathy with macular edema, right eye: Secondary | ICD-10-CM | POA: Diagnosis not present

## 2017-07-14 DIAGNOSIS — E871 Hypo-osmolality and hyponatremia: Secondary | ICD-10-CM | POA: Diagnosis not present

## 2017-07-14 DIAGNOSIS — Z794 Long term (current) use of insulin: Secondary | ICD-10-CM | POA: Diagnosis not present

## 2017-07-14 DIAGNOSIS — E1042 Type 1 diabetes mellitus with diabetic polyneuropathy: Secondary | ICD-10-CM | POA: Diagnosis not present

## 2017-07-16 DIAGNOSIS — R112 Nausea with vomiting, unspecified: Secondary | ICD-10-CM | POA: Diagnosis not present

## 2017-07-16 DIAGNOSIS — E1042 Type 1 diabetes mellitus with diabetic polyneuropathy: Secondary | ICD-10-CM | POA: Diagnosis not present

## 2017-07-16 DIAGNOSIS — R634 Abnormal weight loss: Secondary | ICD-10-CM | POA: Diagnosis not present

## 2017-07-16 DIAGNOSIS — E871 Hypo-osmolality and hyponatremia: Secondary | ICD-10-CM | POA: Diagnosis not present

## 2017-07-17 DIAGNOSIS — E113593 Type 2 diabetes mellitus with proliferative diabetic retinopathy without macular edema, bilateral: Secondary | ICD-10-CM | POA: Diagnosis not present

## 2017-07-17 DIAGNOSIS — H43811 Vitreous degeneration, right eye: Secondary | ICD-10-CM | POA: Diagnosis not present

## 2017-07-18 DIAGNOSIS — J690 Pneumonitis due to inhalation of food and vomit: Secondary | ICD-10-CM | POA: Diagnosis not present

## 2017-07-18 DIAGNOSIS — Z23 Encounter for immunization: Secondary | ICD-10-CM | POA: Diagnosis not present

## 2017-07-18 DIAGNOSIS — I10 Essential (primary) hypertension: Secondary | ICD-10-CM | POA: Diagnosis not present

## 2017-07-30 DIAGNOSIS — Z01818 Encounter for other preprocedural examination: Secondary | ICD-10-CM | POA: Diagnosis not present

## 2017-07-30 DIAGNOSIS — Z1211 Encounter for screening for malignant neoplasm of colon: Secondary | ICD-10-CM | POA: Diagnosis not present

## 2017-08-12 DIAGNOSIS — E109 Type 1 diabetes mellitus without complications: Secondary | ICD-10-CM | POA: Diagnosis not present

## 2017-08-20 DIAGNOSIS — I10 Essential (primary) hypertension: Secondary | ICD-10-CM | POA: Diagnosis not present

## 2017-08-25 DIAGNOSIS — H40013 Open angle with borderline findings, low risk, bilateral: Secondary | ICD-10-CM | POA: Diagnosis not present

## 2017-09-04 DIAGNOSIS — K5730 Diverticulosis of large intestine without perforation or abscess without bleeding: Secondary | ICD-10-CM | POA: Diagnosis not present

## 2017-09-04 DIAGNOSIS — Z1211 Encounter for screening for malignant neoplasm of colon: Secondary | ICD-10-CM | POA: Diagnosis not present

## 2017-09-05 DIAGNOSIS — E109 Type 1 diabetes mellitus without complications: Secondary | ICD-10-CM | POA: Diagnosis not present

## 2017-09-23 DIAGNOSIS — E113511 Type 2 diabetes mellitus with proliferative diabetic retinopathy with macular edema, right eye: Secondary | ICD-10-CM | POA: Diagnosis not present

## 2017-10-02 DIAGNOSIS — Z794 Long term (current) use of insulin: Secondary | ICD-10-CM | POA: Diagnosis not present

## 2017-10-02 DIAGNOSIS — E109 Type 1 diabetes mellitus without complications: Secondary | ICD-10-CM | POA: Diagnosis not present

## 2017-10-20 DIAGNOSIS — I10 Essential (primary) hypertension: Secondary | ICD-10-CM | POA: Diagnosis not present

## 2017-10-20 DIAGNOSIS — E103511 Type 1 diabetes mellitus with proliferative diabetic retinopathy with macular edema, right eye: Secondary | ICD-10-CM | POA: Diagnosis not present

## 2017-10-20 DIAGNOSIS — Z794 Long term (current) use of insulin: Secondary | ICD-10-CM | POA: Diagnosis not present

## 2017-10-20 DIAGNOSIS — E1042 Type 1 diabetes mellitus with diabetic polyneuropathy: Secondary | ICD-10-CM | POA: Diagnosis not present

## 2017-11-24 DIAGNOSIS — E109 Type 1 diabetes mellitus without complications: Secondary | ICD-10-CM | POA: Diagnosis not present

## 2017-11-27 DIAGNOSIS — E103511 Type 1 diabetes mellitus with proliferative diabetic retinopathy with macular edema, right eye: Secondary | ICD-10-CM | POA: Diagnosis not present

## 2017-12-29 DIAGNOSIS — E109 Type 1 diabetes mellitus without complications: Secondary | ICD-10-CM | POA: Diagnosis not present

## 2018-01-09 DIAGNOSIS — E109 Type 1 diabetes mellitus without complications: Secondary | ICD-10-CM | POA: Diagnosis not present

## 2018-01-09 DIAGNOSIS — Z794 Long term (current) use of insulin: Secondary | ICD-10-CM | POA: Diagnosis not present

## 2018-01-29 DIAGNOSIS — E113511 Type 2 diabetes mellitus with proliferative diabetic retinopathy with macular edema, right eye: Secondary | ICD-10-CM | POA: Diagnosis not present

## 2018-02-02 DIAGNOSIS — I10 Essential (primary) hypertension: Secondary | ICD-10-CM | POA: Diagnosis not present

## 2018-02-02 DIAGNOSIS — Z794 Long term (current) use of insulin: Secondary | ICD-10-CM | POA: Diagnosis not present

## 2018-02-02 DIAGNOSIS — E1042 Type 1 diabetes mellitus with diabetic polyneuropathy: Secondary | ICD-10-CM | POA: Diagnosis not present

## 2018-02-02 DIAGNOSIS — E103511 Type 1 diabetes mellitus with proliferative diabetic retinopathy with macular edema, right eye: Secondary | ICD-10-CM | POA: Diagnosis not present

## 2018-02-23 DIAGNOSIS — E113511 Type 2 diabetes mellitus with proliferative diabetic retinopathy with macular edema, right eye: Secondary | ICD-10-CM | POA: Diagnosis not present

## 2018-02-23 DIAGNOSIS — H40023 Open angle with borderline findings, high risk, bilateral: Secondary | ICD-10-CM | POA: Diagnosis not present

## 2018-02-23 DIAGNOSIS — H2513 Age-related nuclear cataract, bilateral: Secondary | ICD-10-CM | POA: Diagnosis not present

## 2018-02-23 DIAGNOSIS — H524 Presbyopia: Secondary | ICD-10-CM | POA: Diagnosis not present

## 2018-03-03 DIAGNOSIS — E113511 Type 2 diabetes mellitus with proliferative diabetic retinopathy with macular edema, right eye: Secondary | ICD-10-CM | POA: Diagnosis not present

## 2018-03-27 DIAGNOSIS — E103593 Type 1 diabetes mellitus with proliferative diabetic retinopathy without macular edema, bilateral: Secondary | ICD-10-CM | POA: Diagnosis not present

## 2018-03-27 DIAGNOSIS — Z23 Encounter for immunization: Secondary | ICD-10-CM | POA: Diagnosis not present

## 2018-03-27 DIAGNOSIS — I10 Essential (primary) hypertension: Secondary | ICD-10-CM | POA: Diagnosis not present

## 2018-03-27 DIAGNOSIS — E785 Hyperlipidemia, unspecified: Secondary | ICD-10-CM | POA: Diagnosis not present

## 2018-04-01 DIAGNOSIS — E109 Type 1 diabetes mellitus without complications: Secondary | ICD-10-CM | POA: Diagnosis not present

## 2018-04-09 DIAGNOSIS — E113511 Type 2 diabetes mellitus with proliferative diabetic retinopathy with macular edema, right eye: Secondary | ICD-10-CM | POA: Diagnosis not present

## 2018-04-12 DIAGNOSIS — Z794 Long term (current) use of insulin: Secondary | ICD-10-CM | POA: Diagnosis not present

## 2018-04-12 DIAGNOSIS — E109 Type 1 diabetes mellitus without complications: Secondary | ICD-10-CM | POA: Diagnosis not present

## 2018-04-22 DIAGNOSIS — E109 Type 1 diabetes mellitus without complications: Secondary | ICD-10-CM | POA: Diagnosis not present

## 2018-05-07 DIAGNOSIS — Z794 Long term (current) use of insulin: Secondary | ICD-10-CM | POA: Diagnosis not present

## 2018-05-07 DIAGNOSIS — E103511 Type 1 diabetes mellitus with proliferative diabetic retinopathy with macular edema, right eye: Secondary | ICD-10-CM | POA: Diagnosis not present

## 2018-05-07 DIAGNOSIS — I10 Essential (primary) hypertension: Secondary | ICD-10-CM | POA: Diagnosis not present

## 2018-05-07 DIAGNOSIS — E1042 Type 1 diabetes mellitus with diabetic polyneuropathy: Secondary | ICD-10-CM | POA: Diagnosis not present

## 2018-06-15 DIAGNOSIS — E113592 Type 2 diabetes mellitus with proliferative diabetic retinopathy without macular edema, left eye: Secondary | ICD-10-CM | POA: Diagnosis not present

## 2018-06-15 DIAGNOSIS — H31093 Other chorioretinal scars, bilateral: Secondary | ICD-10-CM | POA: Diagnosis not present

## 2018-06-15 DIAGNOSIS — H3582 Retinal ischemia: Secondary | ICD-10-CM | POA: Diagnosis not present

## 2018-06-15 DIAGNOSIS — E113511 Type 2 diabetes mellitus with proliferative diabetic retinopathy with macular edema, right eye: Secondary | ICD-10-CM | POA: Diagnosis not present

## 2018-06-30 DIAGNOSIS — E109 Type 1 diabetes mellitus without complications: Secondary | ICD-10-CM | POA: Diagnosis not present

## 2018-07-20 DIAGNOSIS — Z794 Long term (current) use of insulin: Secondary | ICD-10-CM | POA: Diagnosis not present

## 2018-07-20 DIAGNOSIS — E109 Type 1 diabetes mellitus without complications: Secondary | ICD-10-CM | POA: Diagnosis not present

## 2018-07-27 DIAGNOSIS — E109 Type 1 diabetes mellitus without complications: Secondary | ICD-10-CM | POA: Diagnosis not present

## 2018-07-27 DIAGNOSIS — H31093 Other chorioretinal scars, bilateral: Secondary | ICD-10-CM | POA: Diagnosis not present

## 2018-07-27 DIAGNOSIS — H3582 Retinal ischemia: Secondary | ICD-10-CM | POA: Diagnosis not present

## 2018-07-27 DIAGNOSIS — E113592 Type 2 diabetes mellitus with proliferative diabetic retinopathy without macular edema, left eye: Secondary | ICD-10-CM | POA: Diagnosis not present

## 2018-07-27 DIAGNOSIS — E113511 Type 2 diabetes mellitus with proliferative diabetic retinopathy with macular edema, right eye: Secondary | ICD-10-CM | POA: Diagnosis not present

## 2018-07-29 DIAGNOSIS — E113511 Type 2 diabetes mellitus with proliferative diabetic retinopathy with macular edema, right eye: Secondary | ICD-10-CM | POA: Diagnosis not present

## 2018-09-02 DIAGNOSIS — E113511 Type 2 diabetes mellitus with proliferative diabetic retinopathy with macular edema, right eye: Secondary | ICD-10-CM | POA: Diagnosis not present

## 2018-09-09 DIAGNOSIS — H40023 Open angle with borderline findings, high risk, bilateral: Secondary | ICD-10-CM | POA: Diagnosis not present

## 2018-09-14 DIAGNOSIS — I10 Essential (primary) hypertension: Secondary | ICD-10-CM | POA: Diagnosis not present

## 2018-09-14 DIAGNOSIS — Z794 Long term (current) use of insulin: Secondary | ICD-10-CM | POA: Diagnosis not present

## 2018-09-14 DIAGNOSIS — E1042 Type 1 diabetes mellitus with diabetic polyneuropathy: Secondary | ICD-10-CM | POA: Diagnosis not present

## 2018-09-14 DIAGNOSIS — E1165 Type 2 diabetes mellitus with hyperglycemia: Secondary | ICD-10-CM | POA: Diagnosis not present

## 2018-09-14 DIAGNOSIS — E103511 Type 1 diabetes mellitus with proliferative diabetic retinopathy with macular edema, right eye: Secondary | ICD-10-CM | POA: Diagnosis not present

## 2018-10-07 DIAGNOSIS — E113511 Type 2 diabetes mellitus with proliferative diabetic retinopathy with macular edema, right eye: Secondary | ICD-10-CM | POA: Diagnosis not present

## 2018-10-21 DIAGNOSIS — N3000 Acute cystitis without hematuria: Secondary | ICD-10-CM | POA: Diagnosis not present

## 2018-10-21 DIAGNOSIS — Z794 Long term (current) use of insulin: Secondary | ICD-10-CM | POA: Diagnosis not present

## 2018-10-21 DIAGNOSIS — E103593 Type 1 diabetes mellitus with proliferative diabetic retinopathy without macular edema, bilateral: Secondary | ICD-10-CM | POA: Diagnosis not present

## 2018-10-27 DIAGNOSIS — Z794 Long term (current) use of insulin: Secondary | ICD-10-CM | POA: Diagnosis not present

## 2018-10-27 DIAGNOSIS — E109 Type 1 diabetes mellitus without complications: Secondary | ICD-10-CM | POA: Diagnosis not present

## 2018-11-18 DIAGNOSIS — E113511 Type 2 diabetes mellitus with proliferative diabetic retinopathy with macular edema, right eye: Secondary | ICD-10-CM | POA: Diagnosis not present

## 2018-11-23 DIAGNOSIS — E109 Type 1 diabetes mellitus without complications: Secondary | ICD-10-CM | POA: Diagnosis not present

## 2018-12-15 DIAGNOSIS — E109 Type 1 diabetes mellitus without complications: Secondary | ICD-10-CM | POA: Diagnosis not present

## 2018-12-16 DIAGNOSIS — E1065 Type 1 diabetes mellitus with hyperglycemia: Secondary | ICD-10-CM | POA: Diagnosis not present

## 2018-12-18 DIAGNOSIS — Z794 Long term (current) use of insulin: Secondary | ICD-10-CM | POA: Diagnosis not present

## 2018-12-18 DIAGNOSIS — E1042 Type 1 diabetes mellitus with diabetic polyneuropathy: Secondary | ICD-10-CM | POA: Diagnosis not present

## 2018-12-18 DIAGNOSIS — E103511 Type 1 diabetes mellitus with proliferative diabetic retinopathy with macular edema, right eye: Secondary | ICD-10-CM | POA: Diagnosis not present

## 2018-12-18 DIAGNOSIS — I10 Essential (primary) hypertension: Secondary | ICD-10-CM | POA: Diagnosis not present

## 2018-12-30 DIAGNOSIS — E113511 Type 2 diabetes mellitus with proliferative diabetic retinopathy with macular edema, right eye: Secondary | ICD-10-CM | POA: Diagnosis not present

## 2019-02-10 DIAGNOSIS — E113511 Type 2 diabetes mellitus with proliferative diabetic retinopathy with macular edema, right eye: Secondary | ICD-10-CM | POA: Diagnosis not present

## 2019-03-01 DIAGNOSIS — Z794 Long term (current) use of insulin: Secondary | ICD-10-CM | POA: Diagnosis not present

## 2019-03-01 DIAGNOSIS — E109 Type 1 diabetes mellitus without complications: Secondary | ICD-10-CM | POA: Diagnosis not present

## 2019-03-12 DIAGNOSIS — H40023 Open angle with borderline findings, high risk, bilateral: Secondary | ICD-10-CM | POA: Diagnosis not present

## 2019-03-12 DIAGNOSIS — E113511 Type 2 diabetes mellitus with proliferative diabetic retinopathy with macular edema, right eye: Secondary | ICD-10-CM | POA: Diagnosis not present

## 2019-03-12 DIAGNOSIS — H2513 Age-related nuclear cataract, bilateral: Secondary | ICD-10-CM | POA: Diagnosis not present

## 2019-03-12 DIAGNOSIS — H524 Presbyopia: Secondary | ICD-10-CM | POA: Diagnosis not present

## 2019-03-24 DIAGNOSIS — E109 Type 1 diabetes mellitus without complications: Secondary | ICD-10-CM | POA: Diagnosis not present

## 2019-03-24 DIAGNOSIS — E113511 Type 2 diabetes mellitus with proliferative diabetic retinopathy with macular edema, right eye: Secondary | ICD-10-CM | POA: Diagnosis not present

## 2019-03-29 DIAGNOSIS — E109 Type 1 diabetes mellitus without complications: Secondary | ICD-10-CM | POA: Diagnosis not present

## 2019-03-30 DIAGNOSIS — Z125 Encounter for screening for malignant neoplasm of prostate: Secondary | ICD-10-CM | POA: Diagnosis not present

## 2019-03-30 DIAGNOSIS — I10 Essential (primary) hypertension: Secondary | ICD-10-CM | POA: Diagnosis not present

## 2019-03-30 DIAGNOSIS — E785 Hyperlipidemia, unspecified: Secondary | ICD-10-CM | POA: Diagnosis not present

## 2019-03-31 DIAGNOSIS — E1165 Type 2 diabetes mellitus with hyperglycemia: Secondary | ICD-10-CM | POA: Diagnosis not present

## 2019-03-31 DIAGNOSIS — E1042 Type 1 diabetes mellitus with diabetic polyneuropathy: Secondary | ICD-10-CM | POA: Diagnosis not present

## 2019-03-31 DIAGNOSIS — Z125 Encounter for screening for malignant neoplasm of prostate: Secondary | ICD-10-CM | POA: Diagnosis not present

## 2019-03-31 DIAGNOSIS — E785 Hyperlipidemia, unspecified: Secondary | ICD-10-CM | POA: Diagnosis not present

## 2019-04-19 DIAGNOSIS — E103511 Type 1 diabetes mellitus with proliferative diabetic retinopathy with macular edema, right eye: Secondary | ICD-10-CM | POA: Diagnosis not present

## 2019-04-19 DIAGNOSIS — E1042 Type 1 diabetes mellitus with diabetic polyneuropathy: Secondary | ICD-10-CM | POA: Diagnosis not present

## 2019-04-19 DIAGNOSIS — I10 Essential (primary) hypertension: Secondary | ICD-10-CM | POA: Diagnosis not present

## 2019-04-19 DIAGNOSIS — Z794 Long term (current) use of insulin: Secondary | ICD-10-CM | POA: Diagnosis not present

## 2019-04-27 DIAGNOSIS — E109 Type 1 diabetes mellitus without complications: Secondary | ICD-10-CM | POA: Diagnosis not present

## 2019-05-05 DIAGNOSIS — E113511 Type 2 diabetes mellitus with proliferative diabetic retinopathy with macular edema, right eye: Secondary | ICD-10-CM | POA: Diagnosis not present

## 2019-05-10 DIAGNOSIS — R079 Chest pain, unspecified: Secondary | ICD-10-CM | POA: Diagnosis not present

## 2019-05-11 DIAGNOSIS — H04123 Dry eye syndrome of bilateral lacrimal glands: Secondary | ICD-10-CM | POA: Diagnosis not present

## 2019-05-11 DIAGNOSIS — H0100A Unspecified blepharitis right eye, upper and lower eyelids: Secondary | ICD-10-CM | POA: Diagnosis not present

## 2019-05-11 DIAGNOSIS — H40023 Open angle with borderline findings, high risk, bilateral: Secondary | ICD-10-CM | POA: Diagnosis not present

## 2019-05-11 DIAGNOSIS — H2513 Age-related nuclear cataract, bilateral: Secondary | ICD-10-CM | POA: Diagnosis not present

## 2019-05-13 ENCOUNTER — Telehealth: Payer: Self-pay

## 2019-05-13 NOTE — Telephone Encounter (Signed)
FAXED EKG TO [facility] 

## 2019-05-18 DIAGNOSIS — R079 Chest pain, unspecified: Secondary | ICD-10-CM | POA: Diagnosis not present

## 2019-05-20 DIAGNOSIS — R079 Chest pain, unspecified: Secondary | ICD-10-CM | POA: Diagnosis not present

## 2019-05-24 DIAGNOSIS — H31093 Other chorioretinal scars, bilateral: Secondary | ICD-10-CM | POA: Diagnosis not present

## 2019-05-24 DIAGNOSIS — E113511 Type 2 diabetes mellitus with proliferative diabetic retinopathy with macular edema, right eye: Secondary | ICD-10-CM | POA: Diagnosis not present

## 2019-05-24 DIAGNOSIS — H3582 Retinal ischemia: Secondary | ICD-10-CM | POA: Diagnosis not present

## 2019-05-24 DIAGNOSIS — Z794 Long term (current) use of insulin: Secondary | ICD-10-CM | POA: Diagnosis not present

## 2019-05-24 DIAGNOSIS — E109 Type 1 diabetes mellitus without complications: Secondary | ICD-10-CM | POA: Diagnosis not present

## 2019-05-24 DIAGNOSIS — E113592 Type 2 diabetes mellitus with proliferative diabetic retinopathy without macular edema, left eye: Secondary | ICD-10-CM | POA: Diagnosis not present

## 2019-05-28 DIAGNOSIS — Z125 Encounter for screening for malignant neoplasm of prostate: Secondary | ICD-10-CM | POA: Diagnosis not present

## 2019-06-01 ENCOUNTER — Other Ambulatory Visit: Payer: Self-pay

## 2019-06-01 ENCOUNTER — Ambulatory Visit: Payer: BC Managed Care – PPO | Admitting: Cardiovascular Disease

## 2019-06-01 ENCOUNTER — Encounter: Payer: Self-pay | Admitting: Cardiovascular Disease

## 2019-06-01 VITALS — BP 148/93 | HR 88 | Temp 97.9°F | Ht 70.0 in | Wt 178.6 lb

## 2019-06-01 DIAGNOSIS — E109 Type 1 diabetes mellitus without complications: Secondary | ICD-10-CM | POA: Diagnosis not present

## 2019-06-01 DIAGNOSIS — Z8249 Family history of ischemic heart disease and other diseases of the circulatory system: Secondary | ICD-10-CM

## 2019-06-01 DIAGNOSIS — E785 Hyperlipidemia, unspecified: Secondary | ICD-10-CM | POA: Insufficient documentation

## 2019-06-01 DIAGNOSIS — R079 Chest pain, unspecified: Secondary | ICD-10-CM | POA: Diagnosis not present

## 2019-06-01 DIAGNOSIS — I10 Essential (primary) hypertension: Secondary | ICD-10-CM | POA: Diagnosis not present

## 2019-06-01 DIAGNOSIS — E782 Mixed hyperlipidemia: Secondary | ICD-10-CM | POA: Diagnosis not present

## 2019-06-01 DIAGNOSIS — R0602 Shortness of breath: Secondary | ICD-10-CM

## 2019-06-01 MED ORDER — METOPROLOL TARTRATE 100 MG PO TABS: ORAL_TABLET | ORAL | 0 refills | Status: DC

## 2019-06-01 NOTE — Assessment & Plan Note (Signed)
History of essential hypertension with blood pressure measured today 140/93.  He is on lisinopril and hydrochlorothiazide.

## 2019-06-01 NOTE — Assessment & Plan Note (Signed)
History of hyperlipidemia on statin therapy with lipid profile performed 03/31/2019 revealing total cholesterol 145, LDL 91 HDL 39, acceptable for primary prevention

## 2019-06-01 NOTE — Patient Instructions (Addendum)
Medication Instructions:  Your physician recommends that you continue on your current medications as directed. Please refer to the Current Medication list given to you today.  If you need a refill on your cardiac medications before your next appointment, please call your pharmacy.   Lab work: BMET If you have labs (blood work) drawn today and your tests are completely normal, you will receive your results only by: MyChart Message (if you have MyChart) OR A paper copy in the mail If you have any lab test that is abnormal or we need to change your treatment, we will call you to review the results.  Testing/Procedures: Non-Cardiac CT scanning, (CAT scanning), is a noninvasive, special x-ray that produces cross-sectional images of the body using x-rays and a computer. CT scans help physicians diagnose and treat medical conditions. For some CT exams, a contrast material is used to enhance visibility in the area of the body being studied. CT scans provide greater clarity and reveal more details than regular x-ray exams.   Follow-Up: At [facility], you and your health needs are our priority.  As part of our continuing mission to provide you with exceptional heart care, we have created designated Provider Care Teams.  These Care Teams include your primary Cardiologist (physician) and Advanced Practice Providers (APPs -  Physician Assistants and Nurse Practitioners) who all work together to provide you with the care you need, when you need it. You may see Dr [name] or one of the following Advanced Practice Providers on your designated Care Team:    Corine Shelter, PA-C  [place], [place]  Edd Fabian, [place]  Your physician wants you to follow-up in: 3 months. You will receive a reminder letter in the mail two months in advance. If you don't receive a letter, please call our office to schedule the follow-up appointment.  Any Other Special Instructions Will Be Listed Below (If  Applicable).  Your cardiac CT will be scheduled at one of the below locations:   [facility] [address] [place], [place] [address] [phone]   If scheduled at [facility], please arrive at the [facility] main entrance of [facility] 30-45 minutes prior to test start time. Proceed to the [facility] Radiology Department (first floor) to check-in and test prep.  Please follow these instructions carefully (unless otherwise directed):  Hold all erectile dysfunction medications at least 3 days (72 hrs) prior to test.  On the Night Before the Test: . Be sure to Drink plenty of water. . Do not consume any caffeinated/decaffeinated beverages or chocolate 12 hours prior to your test. . Do not take any antihistamines 12 hours prior to your test.  On the Day of the Test: . Drink plenty of water. Do not drink any water within one hour of the test. . Do not eat any food 4 hours prior to the test. . You may take your regular medications prior to the test.  . Take 100mg  metoprolol (Lopressor) two hours prior to test. . HOLD Furosemide/Hydrochlorothiazide morning of the test. . FEMALES- please wear underwire-free bra if available       After the Test: . Drink plenty of water. . After receiving IV contrast, you may experience a mild flushed feeling. This is normal. . On occasion, you may experience a mild rash up to 24 hours after the test. This is not dangerous. If this occurs, you can take Benadryl 25 mg and increase your fluid intake. . If you experience  trouble breathing, this can be serious. If it is severe call 911 IMMEDIATELY. If it is mild, please call our office. . If you take any of these medications: Glipizide/Metformin, Avandament, Glucavance, please do not take 48 hours after completing test unless otherwise instructed.   Once we have confirmed authorization from your insurance company, we will call you to set up a date and time for your test.    For non-scheduling related questions, please contact the cardiac imaging nurse navigator should you have any questions/concerns: Marchia Bond, RN Navigator Cardiac Imaging [facility] [phone] Office

## 2019-06-01 NOTE — Addendum Note (Signed)
Addended by: Captola Teschner on: 06/01/2019 03:07 PM   Modules accepted: Orders  

## 2019-06-01 NOTE — Assessment & Plan Note (Signed)
Family history of heart disease with father who had CABG at age 58 and recently died in his 85s.  Dr. Claiborne Billings was his cardiologist

## 2019-06-01 NOTE — Progress Notes (Signed)
06/01/2019 Harry Moore   [birth year]  [id number]  Primary Physician [name], MD Primary Cardiologist: [name] MD Nicholes Calamity, [place]  HPI:  Harry Moore is a 58 y.o. 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  He was referred by Dr. [name] for cardiovascular dilation because of chest pain.  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  He retired 4 years ago.  History factors include type 1 diabetes since age 26 currently on insulin pump, treated hypertension and hyperlipidemia.  His father did have CAD and had CABG at age 41 and died in his 68s.  Dr. Daphene Jaeger with his cardiologist.  He has had chest pain for a year which is mostly exertional but occurs randomly with different activities.  He is recently seen in the ER for this in [place] and ruled out for myocardial infarction.   Current Meds  Medication Sig  . aspirin 81 MG tablet Take 81 mg by mouth daily.  Marland Kitchen atorvastatin (LIPITOR) 20 MG tablet Take 20 mg by mouth daily.  . hydrochlorothiazide (MICROZIDE) 12.5 MG capsule Take by mouth.  . insulin aspart (NOVOLOG) 100 UNIT/ML injection Inject into the skin.  Marland Kitchen insulin glargine (LANTUS) 100 UNIT/ML injection Inject 14-17 Units into the skin 2 (two) times daily.  Marland Kitchen latanoprost (XALATAN) 0.005 % ophthalmic solution 1 drop at bedtime.  Marland Kitchen lisinopril (PRINIVIL,ZESTRIL) 20 MG tablet Take 20 mg by mouth daily.  . timolol (BETIMOL) 0.25 % ophthalmic solution 1-2 drops 2 (two) times daily.     Allergies  Allergen Reactions  . Latex   . Penicillins     Social History   Socioeconomic History  . Marital status: Unknown    Spouse name: Not on file  . Number of children: Not on file  . Years of education: Not on file  . Highest education level: Not on file  Occupational History  . Not on file  Social Needs   . Financial resource strain: Not on file  . Food insecurity    Worry: Not on file    Inability: Not on file  . Transportation needs    Medical: Not on file    Non-medical: Not on file  Tobacco Use  . Smoking status: Never Smoker  . Smokeless tobacco: Never Used  Substance and Sexual Activity  . Alcohol use: Not on file  . Drug use: Not on file  . Sexual activity: Not on file  Lifestyle  . Physical activity    Days per week: Not on file    Minutes per session: Not on file  . Stress: Not on file  Relationships  . Social Musician on phone: Not on file    Gets together: Not on file    Attends religious service: Not on file    Active member of club or organization: Not on file    Attends meetings of clubs or organizations: Not on file    Relationship status: Not on file  . Intimate partner violence    Fear of current or ex partner: Not on file    Emotionally abused: Not on file    Physically abused: Not on file    Forced sexual activity: Not on file  Other Topics Concern  . Not on file  Social History Narrative  . Not on  file     Review of Systems: General: negative for chills, fever, night sweats or weight changes.  Cardiovascular: negative for chest pain, dyspnea on exertion, edema, orthopnea, palpitations, paroxysmal nocturnal dyspnea or shortness of breath Dermatological: negative for rash Respiratory: negative for cough or wheezing Urologic: negative for hematuria Abdominal: negative for nausea, vomiting, diarrhea, bright red blood per rectum, melena, or hematemesis Neurologic: negative for visual changes, syncope, or dizziness All other systems reviewed and are otherwise negative except as noted above.    Blood pressure (!) 148/93, pulse 88, temperature 97.9 F (36.6 C), height 5\' 10"  (1.778 m), weight 178 lb 9.6 oz (81 kg), SpO2 100 %.  General appearance: alert and no distress Neck: no adenopathy, no carotid bruit, no JVD, supple, symmetrical,  trachea midline and thyroid not enlarged, symmetric, no tenderness/mass/nodules Lungs: clear to auscultation bilaterally Heart: regular rate and rhythm, S1, S2 normal, no murmur, click, rub or gallop Extremities: extremities normal, atraumatic, no cyanosis or edema Pulses: 2+ and symmetric Skin: Skin color, texture, turgor normal. No rashes or lesions Neurologic: Alert and oriented X 3, normal strength and tone. Normal symmetric reflexes. Normal coordination and gait  EKG sinus rhythm at 83 with nonspecific ST and T wave changes.  I personally reviewed this EKG.  ASSESSMENT AND PLAN:   Essential hypertension History of essential hypertension with blood pressure measured today 140/93.  He is on lisinopril and hydrochlorothiazide.  Hyperlipidemia History of hyperlipidemia on statin therapy with lipid profile performed 03/31/2019 revealing total cholesterol 145, LDL 91 HDL 39, acceptable for primary prevention  Family history of heart disease Family history of heart disease with father who had CABG at age 68 and recently died in his 23s.  Dr. Claiborne Billings was his cardiologist  Chest pain of uncertain etiology History of exertional chest pain for about a year.  It occurs sporadically with some activities and not others.  Given his long history of diabetes since age 46 male insulin pump and other risk factors will get a coronary CTA to further evaluate      Lorretta Harp MD [facility], [facility] 06/01/2019 11:43 AM

## 2019-06-01 NOTE — Assessment & Plan Note (Signed)
History of exertional chest pain for about a year.  It occurs sporadically with some activities and not others.  Given his long history of diabetes since age 58 male insulin pump and other risk factors will get a coronary CTA to further evaluate

## 2019-06-02 LAB — BASIC METABOLIC PANEL
BUN/Creatinine Ratio: 18 (ref 9–20)
BUN: 19 mg/dL (ref 6–24)
CO2: 26 mmol/L (ref 20–29)
Calcium: 9.3 mg/dL (ref 8.7–10.2)
Chloride: 101 mmol/L (ref 96–106)
Creatinine, Ser: 1.03 mg/dL (ref 0.76–1.27)
GFR calc Af Amer: 92 mL/min/{1.73_m2} (ref >59)
GFR calc non Af Amer: 80 mL/min/{1.73_m2} (ref >59)
Glucose: 174 mg/dL — ABNORMAL HIGH (ref 65–99)
Potassium: 5.2 mmol/L (ref 3.5–5.2)
Sodium: 139 mmol/L (ref 134–144)

## 2019-06-10 ENCOUNTER — Telehealth: Payer: Self-pay | Admitting: Cardiovascular Disease

## 2019-06-10 NOTE — Telephone Encounter (Signed)
Routed to CT scheduling

## 2019-06-10 NOTE — Telephone Encounter (Signed)
New Message    Pt is calling about his Ct and is wondering if he can have it done before Dec 17th    Please call

## 2019-06-16 DIAGNOSIS — E113511 Type 2 diabetes mellitus with proliferative diabetic retinopathy with macular edema, right eye: Secondary | ICD-10-CM | POA: Diagnosis not present

## 2019-07-13 DIAGNOSIS — E109 Type 1 diabetes mellitus without complications: Secondary | ICD-10-CM | POA: Diagnosis not present

## 2019-07-20 ENCOUNTER — Telehealth: Payer: Self-pay | Admitting: Cardiovascular Disease

## 2019-07-20 NOTE — Telephone Encounter (Signed)
New Message    Pt is calling and is wondering if he needs to get labs before his CT    Please call back

## 2019-07-20 NOTE — Telephone Encounter (Signed)
Pt is aware that he can come anytime for his lab work. He verbalized understanding.

## 2019-07-22 ENCOUNTER — Other Ambulatory Visit: Payer: Self-pay

## 2019-07-22 DIAGNOSIS — R079 Chest pain, unspecified: Secondary | ICD-10-CM

## 2019-07-22 DIAGNOSIS — Z01810 Encounter for preprocedural cardiovascular examination: Secondary | ICD-10-CM

## 2019-07-22 DIAGNOSIS — I10 Essential (primary) hypertension: Secondary | ICD-10-CM | POA: Diagnosis not present

## 2019-07-22 DIAGNOSIS — Z794 Long term (current) use of insulin: Secondary | ICD-10-CM | POA: Diagnosis not present

## 2019-07-22 DIAGNOSIS — E1042 Type 1 diabetes mellitus with diabetic polyneuropathy: Secondary | ICD-10-CM | POA: Diagnosis not present

## 2019-07-22 DIAGNOSIS — E103511 Type 1 diabetes mellitus with proliferative diabetic retinopathy with macular edema, right eye: Secondary | ICD-10-CM | POA: Diagnosis not present

## 2019-07-22 NOTE — Progress Notes (Signed)
bmet  

## 2019-07-23 LAB — BASIC METABOLIC PANEL
BUN/Creatinine Ratio: 17 (ref 9–20)
BUN: 18 mg/dL (ref 6–24)
CO2: 24 mmol/L (ref 20–29)
Calcium: 9.4 mg/dL (ref 8.7–10.2)
Chloride: 104 mmol/L (ref 96–106)
Creatinine, Ser: 1.07 mg/dL (ref 0.76–1.27)
GFR calc Af Amer: 88 mL/min/{1.73_m2} (ref >59)
GFR calc non Af Amer: 76 mL/min/{1.73_m2} (ref >59)
Glucose: 144 mg/dL — ABNORMAL HIGH (ref 65–99)
Potassium: 4.9 mmol/L (ref 3.5–5.2)
Sodium: 142 mmol/L (ref 134–144)

## 2019-07-26 ENCOUNTER — Telehealth (HOSPITAL_COMMUNITY): Payer: Self-pay | Admitting: Emergency Medicine

## 2019-07-26 NOTE — Telephone Encounter (Signed)
Reaching out to patient to offer assistance regarding upcoming cardiac imaging study; pt verbalizes understanding of appt date/time, parking situation and where to check in, pre-test NPO status and medications ordered, and verified current allergies; name and call back number provided for further questions should they arise Kosisochukwu Goldberg RN Navigator Cardiac Imaging [facility] [phone] office [phone] cell 

## 2019-07-27 ENCOUNTER — Encounter (HOSPITAL_COMMUNITY): Payer: Self-pay

## 2019-07-27 ENCOUNTER — Other Ambulatory Visit: Payer: Self-pay

## 2019-07-27 ENCOUNTER — Ambulatory Visit (HOSPITAL_COMMUNITY)
Admission: RE | Admit: 2019-07-27 | Discharge: 2019-07-27 | Disposition: A | Discharge status: Home / Self Care | Payer: BC Managed Care – PPO | Source: Ambulatory Visit | Attending: Cardiovascular Disease | Admitting: Cardiovascular Disease

## 2019-07-27 DIAGNOSIS — I2510 Atherosclerotic heart disease of native coronary artery without angina pectoris: Secondary | ICD-10-CM | POA: Diagnosis not present

## 2019-07-27 DIAGNOSIS — R0602 Shortness of breath: Secondary | ICD-10-CM | POA: Diagnosis not present

## 2019-07-27 MED ORDER — METOPROLOL TARTRATE 5 MG/5ML IV SOLN
INTRAVENOUS | Status: AC
  Filled 2019-07-27: qty 15

## 2019-07-27 MED ORDER — DILTIAZEM HCL 25 MG/5ML IV SOLN
5.0000 mg | Freq: Once | INTRAVENOUS | Status: AC
  Filled 2019-07-27: qty 5

## 2019-07-27 MED ORDER — DILTIAZEM HCL 25 MG/5ML IV SOLN
INTRAVENOUS | Status: AC
  Administered 2019-07-27: 5 mg via INTRAVENOUS
  Filled 2019-07-27: qty 5

## 2019-07-27 MED ORDER — NITROGLYCERIN 0.4 MG SL SUBL: 0.8000 mg | SUBLINGUAL_TABLET | Freq: Once | SUBLINGUAL | Status: AC

## 2019-07-27 MED ORDER — METOPROLOL TARTRATE 5 MG/5ML IV SOLN
5.0000 mg | INTRAVENOUS | Status: DC | PRN
  Administered 2019-07-27: 5 mg via INTRAVENOUS

## 2019-07-27 MED ORDER — NITROGLYCERIN 0.4 MG SL SUBL
SUBLINGUAL_TABLET | SUBLINGUAL | Status: AC
  Administered 2019-07-27: 0.8 mg via SUBLINGUAL
  Filled 2019-07-27: qty 2

## 2019-07-27 MED ORDER — IOHEXOL 350 MG/ML SOLN
80.0000 mL | Freq: Once | INTRAVENOUS | Status: AC | PRN
  Administered 2019-07-27: 80 mL via INTRAVENOUS

## 2019-07-28 DIAGNOSIS — E113511 Type 2 diabetes mellitus with proliferative diabetic retinopathy with macular edema, right eye: Secondary | ICD-10-CM | POA: Diagnosis not present

## 2019-08-04 ENCOUNTER — Encounter: Payer: Self-pay | Admitting: Cardiovascular Disease

## 2019-08-04 ENCOUNTER — Ambulatory Visit: Payer: BC Managed Care – PPO | Admitting: Cardiovascular Disease

## 2019-08-04 ENCOUNTER — Other Ambulatory Visit: Payer: Self-pay

## 2019-08-04 DIAGNOSIS — I10 Essential (primary) hypertension: Secondary | ICD-10-CM | POA: Diagnosis not present

## 2019-08-04 DIAGNOSIS — R079 Chest pain, unspecified: Secondary | ICD-10-CM | POA: Diagnosis not present

## 2019-08-04 MED ORDER — SODIUM CHLORIDE 0.9% FLUSH: 3.0000 mL | Freq: Two times a day (BID) | INTRAVENOUS | Status: DC

## 2019-08-04 NOTE — Progress Notes (Signed)
   08/04/2019 Harry Moore   04/12/1961  [id number]  Primary Physician Miller, Lisa, MD Primary Cardiologist: Khaya Theissen J Roselynne Lortz MD FACP, FACC, FAHA, FSCAI  HPI:  Harry Moore is a 58 y.o.  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  He was referred by Dr. Lisa Miller for cardiovascular dilation because of chest pain. I last saw him in the office 06/01/2019. 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  He retired 4 years ago.  History factors include type 1 diabetes since age 18 currently on insulin pump, treated hypertension and hyperlipidemia.  His father did have CAD and had CABG at age 59 and died in his 80s.  Dr. Tom Kelly was his cardiologist.  He has had chest pain for a year which is mostly exertional but occurs randomly with different activities.  He was seen in the ER for this in [place] and ruled out for myocardial infarction.  Since I saw him 2 months ago he did have a coronary CTA performed 07/27/2019 revealing three-vessel disease, moderate in the circumflex and LAD. He continues to have occasional effort angina.   Current Meds  Medication Sig  . aspirin 81 MG tablet Take 81 mg by mouth daily.  . hydrochlorothiazide (MICROZIDE) 12.5 MG capsule Take by mouth.  . insulin aspart (NOVOLOG) 100 UNIT/ML injection Inject into the skin.  . insulin lispro (HUMALOG) 100 UNIT/ML cartridge Inject 5-10 Units into the skin 3 (three) times daily with meals.  . latanoprost (XALATAN) 0.005 % ophthalmic solution 1 drop at bedtime.  . lisinopril (PRINIVIL,ZESTRIL) 20 MG tablet Take 20 mg by mouth daily.  . rosuvastatin (CRESTOR) 20 MG tablet Take 20 mg by mouth daily.  . timolol (BETIMOL) 0.25 % ophthalmic solution 1-2 drops 2 (two) times daily.     Allergies  Allergen Reactions  . Latex   . Penicillins     Social History    Socioeconomic History  . Marital status: Single    Spouse name: Not on file  . Number of children: Not on file  . Years of education: Not on file  . Highest education level: Not on file  Occupational History  . Not on file  Tobacco Use  . Smoking status: Never Smoker  . Smokeless tobacco: Never Used  Substance and Sexual Activity  . Alcohol use: Not on file  . Drug use: Not on file  . Sexual activity: Not on file  Other Topics Concern  . Not on file  Social History Narrative  . Not on file   Social Determinants of Health   Financial Resource Strain:   . Difficulty of Paying Living Expenses: Not on file  Food Insecurity:   . Worried About Running Out of Food in the Last Year: Not on file  . Ran Out of Food in the Last Year: Not on file  Transportation Needs:   . Lack of Transportation (Medical): Not on file  . Lack of Transportation (Non-Medical): Not on file  Physical Activity:   . Days of Exercise per Week: Not on file  . Minutes of Exercise per Session: Not on file  Stress:   . Feeling of Stress : Not on file  Social Connections:   . Frequency of Communication with Friends and Family: Not on file  . Frequency of Social Gatherings with Friends and Family: Not on file  .   Attends Religious Services: Not on file  . Active Member of Clubs or Organizations: Not on file  . Attends Banker Meetings: Not on file  . Marital Status: Not on file  Intimate Partner Violence:   . Fear of Current or Ex-Partner: Not on file  . Emotionally Abused: Not on file  . Physically Abused: Not on file  . Sexually Abused: Not on file     Review of Systems: General: negative for chills, fever, night sweats or weight changes.  Cardiovascular: negative for chest pain, dyspnea on exertion, edema, orthopnea, palpitations, paroxysmal nocturnal dyspnea or shortness of breath Dermatological: negative for rash Respiratory: negative for cough or wheezing Urologic: negative for  hematuria Abdominal: negative for nausea, vomiting, diarrhea, bright red blood per rectum, melena, or hematemesis Neurologic: negative for visual changes, syncope, or dizziness All other systems reviewed and are otherwise negative except as noted above.    Blood pressure 134/70, pulse 90, temperature (!) 97.3 F (36.3 C), height 5\' 10"  (1.778 m), weight 175 lb (79.4 kg).  General appearance: alert and no distress Neck: no adenopathy, no carotid bruit, no JVD, supple, symmetrical, trachea midline and thyroid not enlarged, symmetric, no tenderness/mass/nodules Lungs: clear to auscultation bilaterally Heart: regular rate and rhythm, S1, S2 normal, no murmur, click, rub or gallop Extremities: extremities normal, atraumatic, no cyanosis or edema Pulses: 2+ and symmetric Skin: Skin color, texture, turgor normal. No rashes or lesions Neurologic: Alert and oriented X 3, normal strength and tone. Normal symmetric reflexes. Normal coordination and gait  EKG not performed today  ASSESSMENT AND PLAN:   Essential hypertension History of essential hypertension with blood pressure measured today 134/70. He is on hydrochlorothiazide and lisinopril.  Hyperlipidemia History of hyperlipidemia on rosuvastatin 20 mg a day with lipid profile performed 03/31/2019 revealing total cholesterol 145, LDL 91 and HDL 39. Given his positive CTA, I did refer his LDL to be less than 70. We will recheck a lipid liver profile.  Chest pain of uncertain etiology History of exertional chest pain with multiple cardiac risk factors status post coronary CTA performed 07/27/2019 revealing three-vessel disease, moderate in the circumflex and LAD. FFR analysis is not yet been performed. Given his family history and risk factors we decided to proceed with outpatient diagnostic coronary angiography. The patient understands that risks included but are not limited to stroke (1 in 1000), death (1 in 1000), kidney failure [usually  temporary] (1 in 500), bleeding (1 in 200), allergic reaction [possibly serious] (1 in 200). The patient understands and agrees to proceed      07/29/2019 MD [facility], [facility] 08/04/2019 10:34 AM

## 2019-08-04 NOTE — Assessment & Plan Note (Signed)
History of exertional chest pain with multiple cardiac risk factors status post coronary CTA performed 07/27/2019 revealing three-vessel disease, moderate in the circumflex and LAD. FFR analysis is not yet been performed. Given his family history and risk factors we decided to proceed with outpatient diagnostic coronary angiography. The patient understands that risks included but are not limited to stroke (1 in 1000), death (1 in 1000), kidney failure [usually temporary] (1 in 500), bleeding (1 in 200), allergic reaction [possibly serious] (1 in 200). The patient understands and agrees to proceed

## 2019-08-04 NOTE — Assessment & Plan Note (Signed)
History of hyperlipidemia on rosuvastatin 20 mg a day with lipid profile performed 03/31/2019 revealing total cholesterol 145, LDL 91 and HDL 39. Given his positive CTA, I did refer his LDL to be less than 70. We will recheck a lipid liver profile.

## 2019-08-04 NOTE — Patient Instructions (Addendum)
Medication Instructions:  Start taking 81mg  Aspirin Daily  If you need a refill on your cardiac medications before your next appointment, please call your pharmacy.   Lab work: Fasting CBC, BMET, Lipids and Hepatic Function If you have labs (blood work) drawn today and your tests are completely normal, you will receive your results only by: MyChart Message (if you have MyChart) OR A paper copy in the mail If you have any lab test that is abnormal or we need to change your treatment, we will call you to review the results.  Testing/Procedures: Your physician has requested that you have a cardiac catheterization on 08/12/19 @ 9:30. Cardiac catheterization is used to diagnose and/or treat various heart conditions. Doctors may recommend this procedure for a number of different reasons. The most common reason is to evaluate chest pain. Chest pain can be a symptom of coronary artery disease (CAD), and cardiac catheterization can show whether plaque is narrowing or blocking your heart's arteries. This procedure is also used to evaluate the valves, as well as measure the blood flow and oxygen levels in different parts of your heart. For further information please visit 10/10/19. Please follow instruction sheet, as given.   Follow-Up: At [facility], you and your health needs are our priority.  As part of our continuing mission to provide you with exceptional heart care, we have created designated Provider Care Teams.  These Care Teams include your primary Cardiologist (physician) and Advanced Practice Providers (APPs -  Physician Assistants and Nurse Practitioners) who all work together to provide you with the care you need, when you need it. You may see Dr. [facility] or one of the following Advanced Practice Providers on your designated Care Team:    [name], PA-C  [place], [place]  [place], Edd Fabian  Your physician wants you to follow-up after procedure. Your appointment will be  made after catheterization.  Any Other Special Instructions Will Be Listed Below (If Applicable).   You are scheduled for a Cardiac Catheterization on Thursday, February 4 with Dr. [id number].  1. Please arrive at the [facility] (Main Entrance A) at [facility]: [address] [place], [place] at 9:30 AM (This time is two hours before your procedure to ensure your preparation). Free valet parking service is available.   Special note: Every effort is made to have your procedure done on time. Please understand that emergencies sometimes delay scheduled procedures.  2. Diet: Do not eat solid foods after midnight.  The patient may have clear liquids until 5am upon the day of the procedure.  3. Labs: You will need to have blood drawn this week  4. Medication instructions in preparation for your procedure:   Contrast Allergy: No  Hold Metformin 24 hours prior to procedure and 48 hours after. Take 1/2 dose of Lantus the night before and hold the morning of procedure.   On the morning of your procedure, take your Aspirin and any morning medicines NOT listed above.  You may use sips of water.  5. Plan for one night stay--bring personal belongings. 6. Bring a current list of your medications and current insurance cards. 7. You MUST have a responsible person to drive you home. 8. Someone MUST be with you the first 24 hours after you arrive home or your discharge will be delayed. 9. Please wear clothes that are easy to get on and off and wear slip-on shoes.  You have to be tested for Covid prior to your  procedure. This will be scheduled for Monday, 08/09/19 at 11:15am. This is a Drive Up Visit at the [facility] [address], [place]. Someone will direct you to the appropriate testing line. Stay in your car and someone will be with you shortly.  Thank you for allowing [place] to care for you!   -- [facility]

## 2019-08-04 NOTE — H&P (View-Only) (Signed)
08/04/2019 RAUN ROUTH   1961/05/13  [id number]  Primary Physician Kathyrn Lass, MD Primary Cardiologist: Lorretta Harp MD Lupe Carney, [place]  HPI:  Harry Moore is a 59 y.o.  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  He was referred by Dr. Kathyrn Lass for cardiovascular dilation because of chest pain. I last saw him in the office 06/01/2019. 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  He retired 4 years ago.  History factors include type 1 diabetes since age 62 currently on insulin pump, treated hypertension and hyperlipidemia.  His father did have CAD and had CABG at age 10 and died in his 75s.  Dr. Ellouise Newer was his cardiologist.  He has had chest pain for a year which is mostly exertional but occurs randomly with different activities.  He was seen in the ER for this in [place] and ruled out for myocardial infarction.  Since I saw him 2 months ago he did have a coronary CTA performed 07/27/2019 revealing three-vessel disease, moderate in the circumflex and LAD. He continues to have occasional effort angina.   Current Meds  Medication Sig  . aspirin 81 MG tablet Take 81 mg by mouth daily.  . hydrochlorothiazide (MICROZIDE) 12.5 MG capsule Take by mouth.  . insulin aspart (NOVOLOG) 100 UNIT/ML injection Inject into the skin.  Marland Kitchen insulin lispro (HUMALOG) 100 UNIT/ML cartridge Inject 5-10 Units into the skin 3 (three) times daily with meals.  . latanoprost (XALATAN) 0.005 % ophthalmic solution 1 drop at bedtime.  Marland Kitchen lisinopril (PRINIVIL,ZESTRIL) 20 MG tablet Take 20 mg by mouth daily.  . rosuvastatin (CRESTOR) 20 MG tablet Take 20 mg by mouth daily.  . timolol (BETIMOL) 0.25 % ophthalmic solution 1-2 drops 2 (two) times daily.     Allergies  Allergen Reactions  . Latex   . Penicillins     Social History    Socioeconomic History  . Marital status: Single    Spouse name: Not on file  . Number of children: Not on file  . Years of education: Not on file  . Highest education level: Not on file  Occupational History  . Not on file  Tobacco Use  . Smoking status: Never Smoker  . Smokeless tobacco: Never Used  Substance and Sexual Activity  . Alcohol use: Not on file  . Drug use: Not on file  . Sexual activity: Not on file  Other Topics Concern  . Not on file  Social History Narrative  . Not on file   Social Determinants of Health   Financial Resource Strain:   . Difficulty of Paying Living Expenses: Not on file  Food Insecurity:   . Worried About Charity fundraiser in the Last Year: Not on file  . Ran Out of Food in the Last Year: Not on file  Transportation Needs:   . Lack of Transportation (Medical): Not on file  . Lack of Transportation (Non-Medical): Not on file  Physical Activity:   . Days of Exercise per Week: Not on file  . Minutes of Exercise per Session: Not on file  Stress:   . Feeling of Stress : Not on file  Social Connections:   . Frequency of Communication with Friends and Family: Not on file  . Frequency of Social Gatherings with Friends and Family: Not on file  .  Attends Religious Services: Not on file  . Active Member of Clubs or Organizations: Not on file  . Attends Banker Meetings: Not on file  . Marital Status: Not on file  Intimate Partner Violence:   . Fear of Current or Ex-Partner: Not on file  . Emotionally Abused: Not on file  . Physically Abused: Not on file  . Sexually Abused: Not on file     Review of Systems: General: negative for chills, fever, night sweats or weight changes.  Cardiovascular: negative for chest pain, dyspnea on exertion, edema, orthopnea, palpitations, paroxysmal nocturnal dyspnea or shortness of breath Dermatological: negative for rash Respiratory: negative for cough or wheezing Urologic: negative for  hematuria Abdominal: negative for nausea, vomiting, diarrhea, bright red blood per rectum, melena, or hematemesis Neurologic: negative for visual changes, syncope, or dizziness All other systems reviewed and are otherwise negative except as noted above.    Blood pressure 134/70, pulse 90, temperature (!) 97.3 F (36.3 C), height 5\' 10"  (1.778 m), weight 175 lb (79.4 kg).  General appearance: alert and no distress Neck: no adenopathy, no carotid bruit, no JVD, supple, symmetrical, trachea midline and thyroid not enlarged, symmetric, no tenderness/mass/nodules Lungs: clear to auscultation bilaterally Heart: regular rate and rhythm, S1, S2 normal, no murmur, click, rub or gallop Extremities: extremities normal, atraumatic, no cyanosis or edema Pulses: 2+ and symmetric Skin: Skin color, texture, turgor normal. No rashes or lesions Neurologic: Alert and oriented X 3, normal strength and tone. Normal symmetric reflexes. Normal coordination and gait  EKG not performed today  ASSESSMENT AND PLAN:   Essential hypertension History of essential hypertension with blood pressure measured today 134/70. He is on hydrochlorothiazide and lisinopril.  Hyperlipidemia History of hyperlipidemia on rosuvastatin 20 mg a day with lipid profile performed 03/31/2019 revealing total cholesterol 145, LDL 91 and HDL 39. Given his positive CTA, I did refer his LDL to be less than 70. We will recheck a lipid liver profile.  Chest pain of uncertain etiology History of exertional chest pain with multiple cardiac risk factors status post coronary CTA performed 07/27/2019 revealing three-vessel disease, moderate in the circumflex and LAD. FFR analysis is not yet been performed. Given his family history and risk factors we decided to proceed with outpatient diagnostic coronary angiography. The patient understands that risks included but are not limited to stroke (1 in 1000), death (1 in 1000), kidney failure [usually  temporary] (1 in 500), bleeding (1 in 200), allergic reaction [possibly serious] (1 in 200). The patient understands and agrees to proceed      07/29/2019 MD [facility], [facility] 08/04/2019 10:34 AM

## 2019-08-04 NOTE — Assessment & Plan Note (Signed)
History of essential hypertension with blood pressure measured today 134/70. He is on hydrochlorothiazide and lisinopril.

## 2019-08-05 DIAGNOSIS — R079 Chest pain, unspecified: Secondary | ICD-10-CM | POA: Diagnosis not present

## 2019-08-05 DIAGNOSIS — I2510 Atherosclerotic heart disease of native coronary artery without angina pectoris: Secondary | ICD-10-CM | POA: Diagnosis not present

## 2019-08-05 DIAGNOSIS — I10 Essential (primary) hypertension: Secondary | ICD-10-CM | POA: Diagnosis not present

## 2019-08-06 LAB — BASIC METABOLIC PANEL
BUN/Creatinine Ratio: 19 (ref 9–20)
BUN: 21 mg/dL (ref 6–24)
CO2: 25 mmol/L (ref 20–29)
Calcium: 9.4 mg/dL (ref 8.7–10.2)
Chloride: 101 mmol/L (ref 96–106)
Creatinine, Ser: 1.1 mg/dL (ref 0.76–1.27)
GFR calc Af Amer: 85 mL/min/{1.73_m2} (ref >59)
GFR calc non Af Amer: 74 mL/min/{1.73_m2} (ref >59)
Glucose: 180 mg/dL — ABNORMAL HIGH (ref 65–99)
Potassium: 4.6 mmol/L (ref 3.5–5.2)
Sodium: 140 mmol/L (ref 134–144)

## 2019-08-06 LAB — CBC
Hematocrit: 38.1 % (ref 37.5–51.0)
Hemoglobin: 12.8 g/dL — ABNORMAL LOW (ref 13.0–17.7)
MCH: 30.2 pg (ref 26.6–33.0)
MCHC: 33.6 g/dL (ref 31.5–35.7)
MCV: 90 fL (ref 79–97)
Platelets: 309 10*3/uL (ref 150–450)
RBC: 4.24 x10E6/uL (ref 4.14–5.80)
RDW: 12 % (ref 11.6–15.4)
WBC: 5.8 10*3/uL (ref 3.4–10.8)

## 2019-08-06 LAB — HEPATIC FUNCTION PANEL
ALT: 18 IU/L (ref 0–44)
AST: 19 IU/L (ref 0–40)
Albumin: 3.9 g/dL (ref 3.8–4.9)
Alkaline Phosphatase: 61 IU/L (ref 39–117)
Bilirubin Total: 0.5 mg/dL (ref 0.0–1.2)
Bilirubin, Direct: 0.13 mg/dL (ref 0.00–0.40)
Total Protein: 6.7 g/dL (ref 6.0–8.5)

## 2019-08-06 LAB — LIPID PANEL
Chol/HDL Ratio: 3.4 ratio (ref 0.0–5.0)
Cholesterol, Total: 139 mg/dL (ref 100–199)
HDL: 41 mg/dL (ref >39)
LDL Chol Calc (NIH): 83 mg/dL (ref 0–99)
Triglycerides: 76 mg/dL (ref 0–149)
VLDL Cholesterol Cal: 15 mg/dL (ref 5–40)

## 2019-08-09 ENCOUNTER — Other Ambulatory Visit (HOSPITAL_COMMUNITY)
Admission: RE | Admit: 2019-08-09 | Discharge: 2019-08-09 | Disposition: A | Discharge status: Home / Self Care | Payer: BC Managed Care – PPO | Source: Ambulatory Visit | Attending: Cardiovascular Disease | Admitting: Cardiovascular Disease

## 2019-08-09 DIAGNOSIS — Z20822 Contact with and (suspected) exposure to covid-19: Secondary | ICD-10-CM | POA: Diagnosis not present

## 2019-08-09 DIAGNOSIS — Z01812 Encounter for preprocedural laboratory examination: Secondary | ICD-10-CM | POA: Insufficient documentation

## 2019-08-09 LAB — SARS CORONAVIRUS 2 (TAT 6-24 HRS): SARS Coronavirus 2: NEGATIVE

## 2019-08-11 ENCOUNTER — Telehealth: Payer: Self-pay | Admitting: *Deleted

## 2019-08-11 ENCOUNTER — Telehealth: Payer: Self-pay

## 2019-08-11 NOTE — Telephone Encounter (Signed)
Patient returned call, he said he confirmed with his ride.  He can be there at 8am tomorrow morning.

## 2019-08-11 NOTE — Telephone Encounter (Signed)
Pt contacted pre-catheterization scheduled at [facility] for: Thursday August 12, 2019 11:30 AM Verified arrival time and place: [facility] Main Entrance A [facility]) at: 9:30 AM    No solid food after midnight prior to cath, clear liquids until 5 AM day of procedure. Contrast allergy: no  Hold: Insulin pump-pt will manage HCTZ-PM prior to procedure  Except hold medications AM meds can be  taken pre-cath with sip of water including: ASA 81 mg   Confirmed patient has responsible adult to drive home post procedure and observe 24 hours after arriving home: yes  Currently, due to Covid-19 pandemic, only one person will be allowed with patient. Must be the same person for patient's entire stay and will be required to wear a mask. They will be asked to wait in the waiting room for the duration of the patient's stay.  Patients are required to wear a mask when they enter the hospital.     COVID-19 Pre-Screening Questions:  . In the past 7 to 10 days have you had a cough,  shortness of breath, headache, congestion, fever (100 or greater) body aches, chills, sore throat, or sudden loss of taste or sense of smell? no . Have you been around anyone with known Covid 19? no . Have you been around anyone who is awaiting Covid 19 test results in the past 7 to 10 days? no . Have you been around anyone who has been exposed to Covid 19, or has mentioned symptoms of Covid 19 within the past 7 to 10 days? no   I reviewed procedure/mask/visitor instructions, Covid-19 screening questions with patient , he verbalized understanding, thanked me for call.

## 2019-08-11 NOTE — Telephone Encounter (Signed)
Called pt to let him know Dr. [name] needs to move his procedure tomorrow 2/4 from 11:30 to 10am. The pt needs to be there at 8am. Pt stated he is going to call his ride to make sure that is doable. Pt will call back to confirm.

## 2019-08-12 ENCOUNTER — Ambulatory Visit (HOSPITAL_COMMUNITY)
Admission: RE | Admit: 2019-08-12 | Discharge: 2019-08-12 | Disposition: A | Discharge status: Home / Self Care | Payer: BC Managed Care – PPO | Attending: Cardiovascular Disease | Admitting: Cardiovascular Disease

## 2019-08-12 ENCOUNTER — Other Ambulatory Visit: Payer: Self-pay

## 2019-08-12 ENCOUNTER — Encounter (HOSPITAL_COMMUNITY)
Admission: RE | Disposition: A | Discharge status: Home / Self Care | Payer: Self-pay | Source: Home / Self Care | Attending: Cardiovascular Disease

## 2019-08-12 DIAGNOSIS — R079 Chest pain, unspecified: Secondary | ICD-10-CM

## 2019-08-12 DIAGNOSIS — Z7982 Long term (current) use of aspirin: Secondary | ICD-10-CM | POA: Diagnosis not present

## 2019-08-12 DIAGNOSIS — Z880 Allergy status to penicillin: Secondary | ICD-10-CM | POA: Insufficient documentation

## 2019-08-12 DIAGNOSIS — I25118 Atherosclerotic heart disease of native coronary artery with other forms of angina pectoris: Secondary | ICD-10-CM | POA: Insufficient documentation

## 2019-08-12 DIAGNOSIS — I10 Essential (primary) hypertension: Secondary | ICD-10-CM | POA: Insufficient documentation

## 2019-08-12 DIAGNOSIS — Z8249 Family history of ischemic heart disease and other diseases of the circulatory system: Secondary | ICD-10-CM | POA: Diagnosis not present

## 2019-08-12 DIAGNOSIS — E785 Hyperlipidemia, unspecified: Secondary | ICD-10-CM | POA: Diagnosis not present

## 2019-08-12 DIAGNOSIS — Z79899 Other long term (current) drug therapy: Secondary | ICD-10-CM | POA: Diagnosis not present

## 2019-08-12 DIAGNOSIS — Z9641 Presence of insulin pump (external) (internal): Secondary | ICD-10-CM | POA: Insufficient documentation

## 2019-08-12 DIAGNOSIS — Z794 Long term (current) use of insulin: Secondary | ICD-10-CM | POA: Insufficient documentation

## 2019-08-12 DIAGNOSIS — E109 Type 1 diabetes mellitus without complications: Secondary | ICD-10-CM | POA: Diagnosis not present

## 2019-08-12 HISTORY — PX: LEFT HEART CATH AND CORONARY ANGIOGRAPHY: CATH118249

## 2019-08-12 LAB — GLUCOSE, CAPILLARY
Glucose-Capillary: 196 mg/dL — ABNORMAL HIGH (ref 70–99)
Glucose-Capillary: 170 mg/dL — ABNORMAL HIGH (ref 70–99)

## 2019-08-12 SURGERY — LEFT HEART CATH AND CORONARY ANGIOGRAPHY
Anesthesia: LOCAL

## 2019-08-12 MED ORDER — HYDRALAZINE HCL 20 MG/ML IJ SOLN: 10.0000 mg | INTRAMUSCULAR | Status: DC | PRN

## 2019-08-12 MED ORDER — SODIUM CHLORIDE 0.9 % IV SOLN: INTRAVENOUS | Status: AC

## 2019-08-12 MED ORDER — ASPIRIN 81 MG PO CHEW: 81.0000 mg | CHEWABLE_TABLET | Freq: Every day | ORAL | Status: DC

## 2019-08-12 MED ORDER — HEPARIN SODIUM (PORCINE) 1000 UNIT/ML IJ SOLN
INTRAMUSCULAR | Status: AC
  Filled 2019-08-12: qty 1

## 2019-08-12 MED ORDER — FENTANYL CITRATE (PF) 100 MCG/2ML IJ SOLN
INTRAMUSCULAR | Status: AC
  Filled 2019-08-12: qty 2

## 2019-08-12 MED ORDER — ONDANSETRON HCL 4 MG/2ML IJ SOLN: 4.0000 mg | Freq: Four times a day (QID) | INTRAMUSCULAR | Status: DC | PRN

## 2019-08-12 MED ORDER — SODIUM CHLORIDE 0.9 % IV SOLN: 250.0000 mL | INTRAVENOUS | Status: DC | PRN

## 2019-08-12 MED ORDER — MIDAZOLAM HCL 2 MG/2ML IJ SOLN
INTRAMUSCULAR | Status: AC
  Filled 2019-08-12: qty 2

## 2019-08-12 MED ORDER — ASPIRIN 81 MG PO CHEW: 81.0000 mg | CHEWABLE_TABLET | ORAL | Status: DC

## 2019-08-12 MED ORDER — HEPARIN (PORCINE) IN NACL 1000-0.9 UT/500ML-% IV SOLN
INTRAVENOUS | Status: DC | PRN
  Administered 2019-08-12: 500 mL

## 2019-08-12 MED ORDER — LABETALOL HCL 5 MG/ML IV SOLN: 10.0000 mg | INTRAVENOUS | Status: DC | PRN

## 2019-08-12 MED ORDER — SODIUM CHLORIDE 0.9% FLUSH: 3.0000 mL | INTRAVENOUS | Status: DC | PRN

## 2019-08-12 MED ORDER — SODIUM CHLORIDE 0.9% FLUSH: 3.0000 mL | Freq: Two times a day (BID) | INTRAVENOUS | Status: DC

## 2019-08-12 MED ORDER — IOHEXOL 350 MG/ML SOLN
INTRAVENOUS | Status: DC | PRN
  Administered 2019-08-12: 11:00:00 165 mL

## 2019-08-12 MED ORDER — HEPARIN (PORCINE) IN NACL 1000-0.9 UT/500ML-% IV SOLN
INTRAVENOUS | Status: AC
  Filled 2019-08-12: qty 1000

## 2019-08-12 MED ORDER — ACETAMINOPHEN 325 MG PO TABS: 650.0000 mg | ORAL_TABLET | ORAL | Status: DC | PRN

## 2019-08-12 MED ORDER — LIDOCAINE HCL (PF) 1 % IJ SOLN
INTRAMUSCULAR | Status: DC | PRN
  Administered 2019-08-12: 2 mL
  Administered 2019-08-12: 15 mL

## 2019-08-12 MED ORDER — LIDOCAINE HCL (PF) 1 % IJ SOLN
INTRAMUSCULAR | Status: AC
  Filled 2019-08-12: qty 30

## 2019-08-12 MED ORDER — VERAPAMIL HCL 2.5 MG/ML IV SOLN
INTRAVENOUS | Status: DC | PRN
  Administered 2019-08-12: 11:00:00 10 mL via INTRA_ARTERIAL

## 2019-08-12 MED ORDER — ATORVASTATIN CALCIUM 80 MG PO TABS: 80.0000 mg | ORAL_TABLET | Freq: Every day | ORAL | Status: DC

## 2019-08-12 MED ORDER — HEPARIN SODIUM (PORCINE) 1000 UNIT/ML IJ SOLN
INTRAMUSCULAR | Status: DC | PRN
  Administered 2019-08-12: 4000 [IU] via INTRAVENOUS

## 2019-08-12 MED ORDER — VERAPAMIL HCL 2.5 MG/ML IV SOLN
INTRAVENOUS | Status: AC
  Filled 2019-08-12: qty 2

## 2019-08-12 MED ORDER — FENTANYL CITRATE (PF) 100 MCG/2ML IJ SOLN
INTRAMUSCULAR | Status: DC | PRN
  Administered 2019-08-12: 25 ug via INTRAVENOUS

## 2019-08-12 MED ORDER — MIDAZOLAM HCL 2 MG/2ML IJ SOLN
INTRAMUSCULAR | Status: DC | PRN
  Administered 2019-08-12: 1 mg via INTRAVENOUS

## 2019-08-12 MED ORDER — SODIUM CHLORIDE 0.9 % WEIGHT BASED INFUSION
3.0000 mL/kg/h | INTRAVENOUS | Status: DC
  Administered 2019-08-12: 08:00:00 3 mL/kg/h via INTRAVENOUS

## 2019-08-12 MED ORDER — NITROGLYCERIN 1 MG/10 ML FOR IR/CATH LAB
INTRA_ARTERIAL | Status: AC
  Filled 2019-08-12: qty 10

## 2019-08-12 MED ORDER — MORPHINE SULFATE (PF) 2 MG/ML IV SOLN: 2.0000 mg | INTRAVENOUS | Status: DC | PRN

## 2019-08-12 MED ORDER — SODIUM CHLORIDE 0.9 % WEIGHT BASED INFUSION: 1.0000 mL/kg/h | INTRAVENOUS | Status: DC

## 2019-08-12 SURGICAL SUPPLY — 18 items
CATH 5FR JL3.5 JR4 ANG PIG MP (CATHETERS) ×2 IMPLANT
CATH INFINITI 5FR JL4 (CATHETERS) ×2 IMPLANT
CATH LAUNCHER 6FR EBU 3 (CATHETERS) ×2 IMPLANT
CATH OPTITORQUE TIG 4.0 5F (CATHETERS) ×2 IMPLANT
CATH VISTA GUIDE 6FR XB3 (CATHETERS) ×2 IMPLANT
CLOSURE MYNX CONTROL 5F (Vascular Products) ×2 IMPLANT
DEVICE RAD COMP TR BAND LRG (VASCULAR PRODUCTS) ×2 IMPLANT
GLIDESHEATH SLEND A-KIT 6F 22G (SHEATH) ×2 IMPLANT
GUIDEWIRE INQWIRE 1.5J.035X260 (WIRE) ×1 IMPLANT
INQWIRE 1.5J .035X260CM (WIRE) ×2
KIT HEART LEFT (KITS) ×2 IMPLANT
PACK CARDIAC CATHETERIZATION (CUSTOM PROCEDURE TRAY) ×2 IMPLANT
SHEATH PINNACLE 5F 10CM (SHEATH) ×2 IMPLANT
SYR MEDRAD MARK 7 150ML (SYRINGE) ×2 IMPLANT
TRANSDUCER W/STOPCOCK (MISCELLANEOUS) ×2 IMPLANT
TUBING CIL FLEX 10 FLL-RA (TUBING) ×2 IMPLANT
WIRE EMERALD 3MM-J .035X150CM (WIRE) ×2 IMPLANT
WIRE HI TORQ VERSACORE-J 145CM (WIRE) ×2 IMPLANT

## 2019-08-12 NOTE — Discharge Instructions (Signed)
Radial Site Care  This sheet gives you information about how to care for yourself after your procedure. Your health care provider may also give you more specific instructions. If you have problems or questions, contact your health care provider. What can I expect after the procedure? After the procedure, it is common to have:  Bruising and tenderness at the catheter insertion area. Follow these instructions at home: Medicines  Take over-the-counter and prescription medicines only as told by your health care provider. Insertion site care  Follow instructions from your health care provider about how to take care of your insertion site. Make sure you: ? Wash your hands with soap and water before you change your bandage (dressing). If soap and water are not available, use hand sanitizer. ? Change your dressing as told by your health care provider. ? Leave stitches (sutures), skin glue, or adhesive strips in place. These skin closures may need to stay in place for 2 weeks or longer. If adhesive strip edges start to loosen and curl up, you may trim the loose edges. Do not remove adhesive strips completely unless your health care provider tells you to do that.  Check your insertion site every day for signs of infection. Check for: ? Redness, swelling, or pain. ? Fluid or blood. ? Pus or a bad smell. ? Warmth.  Do not take baths, swim, or use a hot tub until your health care provider approves.  You may shower 24-48 hours after the procedure, or as directed by your health care provider. ? Remove the dressing and gently wash the site with plain soap and water. ? Pat the area dry with a clean towel. ? Do not rub the site. That could cause bleeding.  Do not apply powder or lotion to the site. Activity   For 24 hours after the procedure, or as directed by your health care provider: ? Do not flex or bend the affected arm. ? Do not push or pull heavy objects with the affected arm. ? Do not  drive yourself home from the hospital or clinic. You may drive 24 hours after the procedure unless your health care provider tells you not to. ? Do not operate machinery or power tools.  Do not lift anything that is heavier than 10 lb (4.5 kg), or the limit that you are told, until your health care provider says that it is safe.  Ask your health care provider when it is okay to: ? Return to work or school. ? Resume usual physical activities or sports. ? Resume sexual activity. General instructions  If the catheter site starts to bleed, raise your arm and put firm pressure on the site. If the bleeding does not stop, get help right away. This is a medical emergency.  If you went home on the same day as your procedure, a responsible adult should be with you for the first 24 hours after you arrive home.  Keep all follow-up visits as told by your health care provider. This is important. Contact a health care provider if:  You have a fever.  You have redness, swelling, or yellow drainage around your insertion site. Get help right away if:  You have unusual pain at the radial site.  The catheter insertion area swells very fast.  The insertion area is bleeding, and the bleeding does not stop when you hold steady pressure on the area.  Your arm or hand becomes pale, cool, tingly, or numb. These symptoms may represent a serious problem   that is an emergency. Do not wait to see if the symptoms will go away. Get medical help right away. Call your local emergency services (911 in the [place] Do not drive yourself to the hospital. Summary  After the procedure, it is common to have bruising and tenderness at the site.  Follow instructions from your health care provider about how to take care of your radial site wound. Check the wound every day for signs of infection.  Do not lift anything that is heavier than 10 lb (4.5 kg), or the limit that you are told, until your health care provider says  that it is safe. This information is not intended to replace advice given to you by your health care provider. Make sure you discuss any questions you have with your health care provider. Document Revised: 07/30/2017 Document Reviewed: 07/30/2017 Elsevier Patient Education  2020 Elsevier Inc. Angiogram, Care After This sheet gives you information about how to care for yourself after your procedure. Your health care provider may also give you more specific instructions. If you have problems or questions, contact your health care provider. What can I expect after the procedure? After the procedure, it is common to have bruising and tenderness at the catheter insertion area. Follow these instructions at home: Insertion site care  Follow instructions from your health care provider about how to take care of your insertion site. Make sure you: ? Wash your hands with soap and water before you change your bandage (dressing). If soap and water are not available, use hand sanitizer. ? Change your dressing as told by your health care provider. ? Leave stitches (sutures), skin glue, or adhesive strips in place. These skin closures may need to stay in place for 2 weeks or longer. If adhesive strip edges start to loosen and curl up, you may trim the loose edges. Do not remove adhesive strips completely unless your health care provider tells you to do that.  Do not take baths, swim, or use a hot tub until your health care provider approves.  You may shower 24-48 hours after the procedure or as told by your health care provider. ? Gently wash the site with plain soap and water. ? Pat the area dry with a clean towel. ? Do not rub the site. This may cause bleeding.  Do not apply powder or lotion to the site. Keep the site clean and dry.  Check your insertion site every day for signs of infection. Check for: ? Redness, swelling, or pain. ? Fluid or blood. ? Warmth. ? Pus or a bad smell. Activity  Rest  as told by your health care provider, usually for 1-2 days.  Do not lift anything that is heavier than 10 lbs. (4.5 kg) or as told by your health care provider.  Do not drive for 24 hours if you were given a medicine to help you relax (sedative).  Do not drive or use heavy machinery while taking prescription pain medicine. General instructions   Return to your normal activities as told by your health care provider, usually in about a week. Ask your health care provider what activities are safe for you.  If the catheter site starts bleeding, lie flat and put pressure on the site. If the bleeding does not stop, get help right away. This is a medical emergency.  Drink enough fluid to keep your urine clear or pale yellow. This helps flush the contrast dye from your body.  Take over-the-counter and prescription medicines only   as told by your health care provider.  Keep all follow-up visits as told by your health care provider. This is important. Contact a health care provider if:  You have a fever or chills.  You have redness, swelling, or pain around your insertion site.  You have fluid or blood coming from your insertion site.  The insertion site feels warm to the touch.  You have pus or a bad smell coming from your insertion site.  You have bruising around the insertion site.  You notice blood collecting in the tissue around the catheter site (hematoma). The hematoma may be painful to the touch. Get help right away if:  You have severe pain at the catheter insertion area.  The catheter insertion area swells very fast.  The catheter insertion area is bleeding, and the bleeding does not stop when you hold steady pressure on the area.  The area near or just beyond the catheter insertion site becomes pale, cool, tingly, or numb. These symptoms may represent a serious problem that is an emergency. Do not wait to see if the symptoms will go away. Get medical help right away. Call  your local emergency services (911 in the [place] Do not drive yourself to the hospital. Summary  After the procedure, it is common to have bruising and tenderness at the catheter insertion area.  After the procedure, it is important to rest and drink plenty of fluids.  Do not take baths, swim, or use a hot tub until your health care provider says it is okay to do so. You may shower 24-48 hours after the procedure or as told by your health care provider.  If the catheter site starts bleeding, lie flat and put pressure on the site. If the bleeding does not stop, get help right away. This is a medical emergency. This information is not intended to replace advice given to you by your health care provider. Make sure you discuss any questions you have with your health care provider. Document Revised: 06/06/2017 Document Reviewed: 05/29/2016 Elsevier Patient Education  2020 Elsevier Inc.   

## 2019-08-12 NOTE — Interval H&P Note (Signed)
Cath Lab Visit (complete for each Cath Lab visit)  Clinical Evaluation Leading to the Procedure:   ACS: No.  Non-ACS:    Anginal Classification: CCS II  Anti-ischemic medical therapy: Minimal Therapy (1 class of medications)  Non-Invasive Test Results: No non-invasive testing performed  Prior CABG: No previous CABG      History and Physical Interval Note:  08/12/2019 10:12 AM  Harry Moore  has presented today for surgery, with the diagnosis of chest pain.  The various methods of treatment have been discussed with the patient and family. After consideration of risks, benefits and other options for treatment, the patient has consented to  Procedure(s): LEFT HEART CATH AND CORONARY ANGIOGRAPHY (N/A) as a surgical intervention.  The patient's history has been reviewed, patient examined, no change in status, stable for surgery.  I have reviewed the patient's chart and labs.  Questions were answered to the patient's satisfaction.     [name]

## 2019-08-18 ENCOUNTER — Encounter: Payer: Self-pay | Admitting: Cardiothoracic Surgery

## 2019-08-18 ENCOUNTER — Institutional Professional Consult (permissible substitution): Payer: BC Managed Care – PPO | Admitting: Cardiothoracic Surgery

## 2019-08-18 ENCOUNTER — Other Ambulatory Visit: Payer: Self-pay | Admitting: *Deleted

## 2019-08-18 ENCOUNTER — Other Ambulatory Visit: Payer: Self-pay

## 2019-08-18 VITALS — BP 143/85 | HR 87 | Temp 97.7°F | Resp 16 | Ht 70.0 in | Wt 175.0 lb

## 2019-08-18 DIAGNOSIS — I2510 Atherosclerotic heart disease of native coronary artery without angina pectoris: Secondary | ICD-10-CM | POA: Diagnosis not present

## 2019-08-18 DIAGNOSIS — I200 Unstable angina: Secondary | ICD-10-CM | POA: Diagnosis not present

## 2019-08-18 MED ORDER — METOPROLOL TARTRATE 25 MG PO TABS: 12.5000 mg | ORAL_TABLET | Freq: Two times a day (BID) | ORAL | 0 refills | Status: DC

## 2019-08-18 NOTE — Progress Notes (Signed)
PCP is Harry Hazel, MD Referring Provider is Harry Gess, MD  Chief Complaint  Patient presents with  . Coronary Artery Disease    eval for CABG...CATH 08/12/19, CORONARY MORPH. 07/27/19   Patient examined, images of coronary angiograms personally reviewed and discussed with patient. 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  Recent cardiac catheterization by Dr. Nanetta Moore demonstrates high-grade stenosis of the proximal LAD and OM1.  The RCA has no significant disease.  There is a small diagonal between lesions in the proximal LAD.  LVEDP is normal.  EF by ventriculogram is normal.  The patient has taken good care of his diabetes over the years and is now on a insulin pump with a sensor.  He has some neuropathy in his lower extremities but no evidence of peripheral vascular disease, diabetic ulcers, retinopathy or renal disease.  Lipid panel checked by Dr. Allyson Moore shows LDL 84 on Crestor 20 mg.  No history of thoracic trauma, chronic pulmonary disease, smoking, or carotid disease. His father had CABG at age 58 as well.  Past Medical History:  Diagnosis Date  . Diabetes mellitus without complication (HCC)   . Hyperlipidemia   . Hypertension     Past Surgical History:  Procedure Laterality Date  . CLEFT PALATE REPAIR    . LEFT HEART CATH AND CORONARY ANGIOGRAPHY N/A 08/12/2019   Procedure: LEFT HEART CATH AND CORONARY ANGIOGRAPHY;  Surgeon: Harry Gess, MD;  Location: [facility];  Service: Cardiovascular;  Laterality: N/A;    No family history on file.  Social History Social History   Tobacco Use  . Smoking status: Never Smoker  . Smokeless tobacco: Never Used  Substance Use Topics  . Alcohol use: Not on file  . Drug use: Not on file    Current Outpatient Medications  Medication Sig Dispense Refill  . aspirin EC 81 MG tablet Take  81 mg by mouth at bedtime.    . docusate sodium (COLACE) 100 MG capsule Take 100 mg by mouth at bedtime.    . hydrochlorothiazide (HYDRODIURIL) 25 MG tablet Take 12.5 mg by mouth at bedtime.    . insulin aspart (NOVOLOG) 100 UNIT/ML injection Inject into the skin. Via Insulin PUMP    . Insulin Human (INSULIN PUMP) SOLN Inject into the skin. insulin aspart (NOVOLOG) 100 UNIT/ML injection    . latanoprost (XALATAN) 0.005 % ophthalmic solution Place 1 drop into both eyes at bedtime.     Marland Kitchen lisinopril (PRINIVIL,ZESTRIL) 20 MG tablet Take 20 mg by mouth at bedtime.     . rosuvastatin (CRESTOR) 20 MG tablet Take 20 mg by mouth at bedtime.     . timolol (TIMOPTIC) 0.5 % ophthalmic solution Place 1 drop into both eyes daily.    . metoprolol tartrate (LOPRESSOR) 25 MG tablet Take 0.5 tablets (12.5 mg total) by mouth 2 (two) times daily. 30 tablet 0   Current Facility-Administered Medications  Medication Dose Route Frequency Provider Last Rate Last Admin  . sodium chloride flush (NS) 0.9 % injection 3 mL  3 mL Intravenous Q12H Harry Gess, MD        Allergies  Allergen Reactions  . Latex Other (See Comments)    Contact dermatitis  . Penicillins Rash    Did it involve swelling of the face/tongue/throat, SOB, or low BP? No Did it involve sudden or severe rash/hives, skin peeling,  or any reaction on the inside of your mouth or nose? Yes Did you need to seek medical attention at a hospital or doctor's office? No When did it last happen?childhood reaction. If all above answers are "NO", may proceed with cephalosporin use.     Review of Systems   The patient is single and his brother who is also retired will be present to help him after surgery and after discharge. The patient is right-hand dominant The patient has had previous surgery to repair a cleft palate as an infant. The patient is allergic to penicillin but can take cephalosporins. No history of DVT varicose veins or venous  insufficiency Previous colonoscopy was clean. No active dental complaints.  BP (!) 143/85 (BP Location: Right Arm, Patient Position: Sitting, Cuff Size: Normal)   Pulse 87   Temp 97.7 F (36.5 C)   Resp 16   Ht 5\' 10"  (1.778 m)   Wt 175 lb (79.4 kg)   SpO2 100% Comment: RA  BMI 25.11 kg/m  Physical Exam     Physical Exam  General: Well-nourished healthy-appearing middle-aged male no acute distress HEENT: Normocephalic pupils equal , dentition adequate Neck: Supple without JVD, adenopathy, or bruit Chest: Clear to auscultation, symmetrical breath sounds, no rhonchi, no tenderness             or deformity Cardiovascular: Regular rate and rhythm, no murmur, no gallop, peripheral pulses             palpable in all extremities Abdomen:  Soft, nontender, no palpable mass or organomegaly Extremities: Warm, well-perfused, no clubbing cyanosis edema or tenderness,              no venous stasis changes of the legs Rectal/GU: Deferred Neuro: Grossly non--focal and symmetrical throughout Skin: Clean and dry without rash or ulceration   Diagnostic Tests: Coronary xerograms show severe proximal LAD and circumflex stenosis with normal LV function  Impression: Unstable angina with history of type 1 diabetes and multivessel CAD  Plan: Patient would benefit from multivessel CABG using arterial grafts to the LAD and OM.  We discussed the details of the procedure as well as the preoperative preparation and postoperative hospital stay.  The patient's risk for surgery would be low with a less than 3% risk of stroke, bleeding, infection, organ failure.  Surgical be scheduled at [facility] on Tuesday, February 16.   Harry Childs, MD [facility] [phone]

## 2019-08-19 DIAGNOSIS — E109 Type 1 diabetes mellitus without complications: Secondary | ICD-10-CM | POA: Diagnosis not present

## 2019-08-19 DIAGNOSIS — Z794 Long term (current) use of insulin: Secondary | ICD-10-CM | POA: Diagnosis not present

## 2019-08-19 NOTE — Progress Notes (Signed)
[id number] [place], [place] - [address] [place] [address] Phone: [phone] Fax: [phone]      Your procedure is scheduled on Tuesday Aug 24, 2019.  Report to [facility] Main Entrance "A" at 05:30 A.M., and check in at the Admitting office.  Call this number if you have problems the morning of surgery:  [phone]  Call [phone] if you have any questions prior to your surgery date Monday-Friday 8am-4pm    Remember:  Do not eat or drink after midnight the night before your surgery   Take these medicines the morning of surgery with A SIP OF WATER: metoprolol tartrate (LOPRESSOR)   timolol (TIMOPTIC)    As of today, STOP taking any Aspirin (unless otherwise instructed by your surgeon), Aleve, Naproxen, Ibuprofen, Motrin, Advil, Goody's, BC's, all herbal medications, fish oil, and all vitamins.   (Follow your surgeon's instructions on when to stop Aspirin.  If no instructions were given by your surgeon then you will need to call the office to get those  Instructions)    WHAT DO I DO ABOUT MY DIABETES MEDICATION?   Marland Kitchen Do not take oral diabetes medicines (pills) the morning of surgery.  . THE NIGHT BEFORE SURGERY, no bedtime dose     . THE MORNING OF SURGERY, If your CBG is greater than 220 mg/dL, you may take  of your sliding scale (correction) dose of insulin.   HOW TO MANAGE YOUR DIABETES BEFORE AND AFTER SURGERY  Why is it important to control my blood sugar before and after surgery? . Improving blood sugar levels before and after surgery helps healing and can limit problems. . A way of improving blood sugar control is eating a healthy diet by: o  Eating less sugar and carbohydrates o  Increasing activity/exercise o  Talking with your doctor about reaching your blood sugar goals . High blood sugars (greater than 180 mg/dL) can raise your risk of infections and slow your recovery, so you will need to focus on controlling your  diabetes during the weeks before surgery. . Make sure that the doctor who takes care of your diabetes knows about your planned surgery including the date and location.  How do I manage my blood sugar before surgery? . Check your blood sugar at least 4 times a day, starting 2 days before surgery, to make sure that the level is not too high or low. . Check your blood sugar the morning of your surgery when you wake up and every 2 hours until you get to the Short Stay unit. o If your blood sugar is less than 70 mg/dL, you will need to treat for low blood sugar: - Do not take insulin. - Treat a low blood sugar (less than 70 mg/dL) with  cup of clear juice (cranberry or apple), 4 glucose tablets, OR glucose gel. - Recheck blood sugar in 15 minutes after treatment (to make sure it is greater than 70 mg/dL). If your blood sugar is not greater than 70 mg/dL on recheck, call [phone] for further instructions. . Report your blood sugar to the short stay nurse when you get to Short Stay.  . If you are admitted to the hospital after surgery: o Your blood sugar will be checked by the staff and you will probably be given insulin after surgery (instead of oral diabetes medicines) to make sure you have good blood sugar levels. o The goal for blood sugar control after surgery is 80-180 mg/dL.  Marland Kitchen  Patients with Insulin Pumps  . For patients with Insulin Pumps: o Contact your diabetes doctor for specific instructions before surgery. o Decrease basal insulin rates by 20% at midnight the night before surgery. o Note that if your surgery is planned to be longer than 2 hours, your insulin pump will be removed and intravenous (IV) insulin will be started and managed by the nurses and anesthesiologist. You will be able to restart your insulin pump once you are awake and able to manage it. o Make sure to bring insulin pump supplies to the hospital with you in case your site needs to be changed.    The Morning of  Surgery  Do not wear jewelry, make-up or nail polish.  Do not wear lotions, powders, colognes, or deodorant  Men may shave face and neck.  Do not bring valuables to the hospital.  [facility] is not responsible for any belongings or valuables.  If you are a smoker, DO NOT Smoke 24 hours prior to surgery  If you wear a CPAP at night please bring your mask the morning of surgery   Remember that you must have someone to transport you home after your surgery, and remain with you for 24 hours if you are discharged the same day.   Please bring cases for contacts, glasses, hearing aids, dentures or bridgework because it cannot be worn into surgery.    Leave your suitcase in the car.  After surgery it may be brought to your room.  For patients admitted to the hospital, discharge time will be determined by your treatment team.  Patients discharged the day of surgery will not be allowed to drive home.    Special instructions:   [facility]- Preparing For Surgery  Before surgery, you can play an important role. Because skin is not sterile, your skin needs to be as free of germs as possible. You can reduce the number of germs on your skin by washing with CHG (chlorahexidine gluconate) Soap before surgery.  CHG is an antiseptic cleaner which kills germs and bonds with the skin to continue killing germs even after washing.    Oral Hygiene is also important to reduce your risk of infection.  Remember - BRUSH YOUR TEETH THE MORNING OF SURGERY WITH YOUR REGULAR TOOTHPASTE  Please do not use if you have an allergy to CHG or antibacterial soaps. If your skin becomes reddened/irritated stop using the CHG.  Do not shave (including legs and underarms) for at least 48 hours prior to first CHG shower. It is OK to shave your face.  Please follow these instructions carefully.   1. Shower the NIGHT BEFORE SURGERY and the MORNING OF SURGERY with CHG Soap.   2. If you chose to wash your hair, wash your hair  first as usual with your normal shampoo.  3. After you shampoo, rinse your hair and body thoroughly to remove the shampoo.  4. Use CHG as you would any other liquid soap. You can apply CHG directly to the skin and wash gently with a scrungie or a clean washcloth.   5. Apply the CHG Soap to your body ONLY FROM THE NECK DOWN.  Do not use on open wounds or open sores. Avoid contact with your eyes, ears, mouth and genitals (private parts). Wash Face and genitals (private parts)  with your normal soap.   6. Wash thoroughly, paying special attention to the area where your surgery will be performed.  7. Thoroughly rinse your body with warm  water from the neck down.  8. DO NOT shower/wash with your normal soap after using and rinsing off the CHG Soap.  9. Pat yourself dry with a CLEAN TOWEL.  10. Wear CLEAN PAJAMAS to bed the night before surgery, wear comfortable clothes the morning of surgery  11. Place CLEAN SHEETS on your bed the night of your first shower and DO NOT SLEEP WITH PETS.    Day of Surgery:  Please shower the morning of surgery with the CHG soap Do not apply any deodorants/lotions. Please wear clean clothes to the hospital/surgery center.   Remember to brush your teeth WITH YOUR REGULAR TOOTHPASTE.   Please read over the following fact sheets that you were given.

## 2019-08-20 ENCOUNTER — Encounter (HOSPITAL_COMMUNITY)
Admission: RE | Admit: 2019-08-20 | Discharge: 2019-08-20 | Disposition: A | Discharge status: Home / Self Care | Payer: BC Managed Care – PPO | Source: Ambulatory Visit | Attending: Cardiothoracic Surgery | Admitting: Cardiothoracic Surgery

## 2019-08-20 ENCOUNTER — Other Ambulatory Visit: Payer: Self-pay

## 2019-08-20 ENCOUNTER — Other Ambulatory Visit (HOSPITAL_COMMUNITY)
Admission: RE | Admit: 2019-08-20 | Discharge: 2019-08-20 | Disposition: A | Discharge status: Home / Self Care | Payer: BC Managed Care – PPO | Source: Ambulatory Visit | Attending: Cardiothoracic Surgery | Admitting: Cardiothoracic Surgery

## 2019-08-20 ENCOUNTER — Encounter (HOSPITAL_COMMUNITY): Payer: Self-pay

## 2019-08-20 ENCOUNTER — Ambulatory Visit (HOSPITAL_COMMUNITY)
Admission: RE | Admit: 2019-08-20 | Discharge: 2019-08-20 | Disposition: A | Discharge status: Home / Self Care | Payer: BC Managed Care – PPO | Source: Ambulatory Visit | Attending: Cardiothoracic Surgery | Admitting: Cardiothoracic Surgery

## 2019-08-20 DIAGNOSIS — Z79899 Other long term (current) drug therapy: Secondary | ICD-10-CM | POA: Diagnosis not present

## 2019-08-20 DIAGNOSIS — E109 Type 1 diabetes mellitus without complications: Secondary | ICD-10-CM | POA: Diagnosis not present

## 2019-08-20 DIAGNOSIS — E785 Hyperlipidemia, unspecified: Secondary | ICD-10-CM | POA: Insufficient documentation

## 2019-08-20 DIAGNOSIS — Z20822 Contact with and (suspected) exposure to covid-19: Secondary | ICD-10-CM | POA: Diagnosis not present

## 2019-08-20 DIAGNOSIS — Z01818 Encounter for other preprocedural examination: Secondary | ICD-10-CM | POA: Diagnosis not present

## 2019-08-20 DIAGNOSIS — Z9641 Presence of insulin pump (external) (internal): Secondary | ICD-10-CM | POA: Insufficient documentation

## 2019-08-20 DIAGNOSIS — Z794 Long term (current) use of insulin: Secondary | ICD-10-CM | POA: Diagnosis not present

## 2019-08-20 DIAGNOSIS — Z7982 Long term (current) use of aspirin: Secondary | ICD-10-CM | POA: Diagnosis not present

## 2019-08-20 DIAGNOSIS — I10 Essential (primary) hypertension: Secondary | ICD-10-CM | POA: Diagnosis not present

## 2019-08-20 DIAGNOSIS — I2510 Atherosclerotic heart disease of native coronary artery without angina pectoris: Secondary | ICD-10-CM

## 2019-08-20 DIAGNOSIS — E1040 Type 1 diabetes mellitus with diabetic neuropathy, unspecified: Secondary | ICD-10-CM | POA: Insufficient documentation

## 2019-08-20 HISTORY — DX: Atherosclerotic heart disease of native coronary artery without angina pectoris: I25.10

## 2019-08-20 HISTORY — DX: Myoneural disorder, unspecified: G70.9

## 2019-08-20 HISTORY — DX: Pneumonia, unspecified organism: J18.9

## 2019-08-20 LAB — CBC
HCT: 38.9 % — ABNORMAL LOW (ref 39.0–52.0)
Hemoglobin: 12.8 g/dL — ABNORMAL LOW (ref 13.0–17.0)
MCH: 30.6 pg (ref 26.0–34.0)
MCHC: 32.9 g/dL (ref 30.0–36.0)
MCV: 93.1 fL (ref 80.0–100.0)
Platelets: 316 10*3/uL (ref 150–400)
RBC: 4.18 MIL/uL — ABNORMAL LOW (ref 4.22–5.81)
RDW: 11.9 % (ref 11.5–15.5)
WBC: 7.1 10*3/uL (ref 4.0–10.5)
nRBC: 0 % (ref 0.0–0.2)

## 2019-08-20 LAB — BLOOD GAS, ARTERIAL
Acid-Base Excess: 1.5 mmol/L (ref 0.0–2.0)
Bicarbonate: 24.9 mmol/L (ref 20.0–28.0)
Drawn by: 421801
FIO2: 21
O2 Saturation: 99.1 %
Patient temperature: 37
pCO2 arterial: 35.4 mmHg (ref 32.0–48.0)
pH, Arterial: 7.462 — ABNORMAL HIGH (ref 7.350–7.450)
pO2, Arterial: 128 mmHg — ABNORMAL HIGH (ref 83.0–108.0)

## 2019-08-20 LAB — SARS CORONAVIRUS 2 (TAT 6-24 HRS): SARS Coronavirus 2: NEGATIVE

## 2019-08-20 LAB — COMPREHENSIVE METABOLIC PANEL
ALT: 23 U/L (ref 0–44)
AST: 21 U/L (ref 15–41)
Albumin: 3.8 g/dL (ref 3.5–5.0)
Alkaline Phosphatase: 58 U/L (ref 38–126)
Anion gap: 8 (ref 5–15)
BUN: 20 mg/dL (ref 6–20)
CO2: 25 mmol/L (ref 22–32)
Calcium: 9.2 mg/dL (ref 8.9–10.3)
Chloride: 104 mmol/L (ref 98–111)
Creatinine, Ser: 1.06 mg/dL (ref 0.61–1.24)
GFR calc Af Amer: >60 mL/min (ref >60)
GFR calc non Af Amer: >60 mL/min (ref >60)
Glucose, Bld: 161 mg/dL — ABNORMAL HIGH (ref 70–99)
Potassium: 4.6 mmol/L (ref 3.5–5.1)
Sodium: 137 mmol/L (ref 135–145)
Total Bilirubin: 0.9 mg/dL (ref 0.3–1.2)
Total Protein: 7.2 g/dL (ref 6.5–8.1)

## 2019-08-20 LAB — URINALYSIS, ROUTINE W REFLEX MICROSCOPIC
Bilirubin Urine: NEGATIVE
Glucose, UA: NEGATIVE mg/dL
Hgb urine dipstick: NEGATIVE
Ketones, ur: 5 mg/dL — AB
Leukocytes,Ua: NEGATIVE
Nitrite: NEGATIVE
Protein, ur: NEGATIVE mg/dL
Specific Gravity, Urine: 1.015 (ref 1.005–1.030)
pH: 6 (ref 5.0–8.0)

## 2019-08-20 LAB — ABO/RH: ABO/RH(D): A POS

## 2019-08-20 LAB — APTT: aPTT: 27 s (ref 24–36)

## 2019-08-20 LAB — GLUCOSE, CAPILLARY: Glucose-Capillary: 153 mg/dL — ABNORMAL HIGH (ref 70–99)

## 2019-08-20 LAB — PROTIME-INR
INR: 1 (ref 0.8–1.2)
Prothrombin Time: 13.1 seconds (ref 11.4–15.2)

## 2019-08-20 LAB — SURGICAL PCR SCREEN
MRSA, PCR: NEGATIVE
Staphylococcus aureus: NEGATIVE

## 2019-08-20 LAB — HEMOGLOBIN A1C
Hgb A1c MFr Bld: 7.3 % — ABNORMAL HIGH (ref 4.8–5.6)
Mean Plasma Glucose: 162.81 mg/dL

## 2019-08-20 NOTE — Progress Notes (Addendum)
Notified Dr. Zenaida Niece Trigt's scheduler Darius Bump about pt's positive ketones in [facility].

## 2019-08-20 NOTE — Research (Addendum)
[employer] Informed Consent   Subject Name: Harry Moore  Subject met inclusion and exclusion criteria.  The informed consent form, study requirements and expectations were reviewed with the subject and questions and concerns were addressed prior to the signing of the consent form.  The subject verbalized understanding of the trail requirements.  The subject agreed to participate in the [employer] trial and signed the informed consent.  The informed consent was obtained prior to performance of any protocol-specific procedures for the subject.  A copy of the signed informed consent was given to the subject and a copy was placed in the subject's medical record.  Visit 1 Screening labs and urine collected   Reviewed of history and physical by investigator.   Yes No Inclusion  [x] [] Institutional Review Board (IRB)/Independent Ethics Committee (IEC)-approved written informed consent and privacy language as per national regulations (e.g., [place] for [place] sites) must be obtained from the subject or legally authorized representative prior to any study-related procedures (including withdrawal of prohibited medication).  [x] [] Subject agrees not to participate in another interventional study after signing the informed consent form (ICF) and until the end of study (EoS) visit has been completed.   [x] [] Subject is ? 59 years of age at time of screening (visit   [x] [] Subject undergoing non-emergent open chest cardiovascular surgery with use of CPB (i.e., CABG and/or valve surgery [including aortic root and ascending aorta surgery, without circulatory arrest]) within 4 weeks of screening (visit 1).  [] [] Subject has moderate/high risk of developing AKI following surgery: [x] Only CABG, or single valve surgery: subject requires to have at least 2 AKI risk factors at screening []Combined CABG and surgery for 1 or more valves: subject requires  to have at least 1 AKI risk factor at screening []Surgery for more than 1 cardiac valve: subject requires to have at least 1 AKI risk factor at screening []Surgery for aortic root or ascending aorta without circulatory arrest: subject requires to have at least 1 AKI risk factor at screening  AKI risk factors: [] Age at screening of ? 19 years [] Documented history of eGFR < 60 mL/min per 1.73 m2 as per Modification of Diet in Renal Disease Study (MDRD) or CKD-EPI equation (or documented measured GFR assessment) []o History needs to be present for at least 90 days prior to screening. Both SCr and eGFR need to be documented in the chart, and []o eGFR at screening or baseline needs to be < 60 mL/min per 1.73 m2, as well as per CKD-EPI equation. [] Documented history of congestive heart failure requiring hospitalization. This condition should exist for at least 90 days prior to screening. [x] Documented history of diabetes mellitus (type 1 or 2) of ? 90 days prior to screening, and subject is on active antidiabetic medication treatment for ? 90 days. [] Documented history of proteinuria/albuminuria at any time point before screening []o Urinary dipstick result of ? 2+, OR []o Documented urinary albumin creatinine ratio measurement of ? 300 mg/g, or []o Documented total quantity of protein in a 24-hour urine collection test ? 0.3 g/day.  [x] [] Subject must have the ability, in the opinion of the investigator, and willingness to return for all scheduled visits and perform all assessments.  [] [] A male subject is eligible to participate if she is not pregnant see [Appendix 12.3 Contraception Requirements] and at least 1 of the following conditions applies: [] Not  a woman of childbearing potential (WOCBP) as defined in [Appendix 12.3 Contraception Requirements] OR [] WOCBP who agrees to follow the contraceptive guidance as defined in [Appendix 12.3 Contraception Requirements] throughout the  treatment period and for at least 30 days after the final study drug administration.  [] [] Male subject must agree not to breastfeed starting at screening and throughout the study period, and for 30 days after the final study drug administration.  [] [] Male subject must not donate ova starting at screening and throughout the study period, and for 30 days after the final study drug administration.  [x] [] A male subject with male partner(s) of childbearing potential must agree to use contraception as detailed in [Appendix 12.3 Contraception Requirements] during the treatment period and for at least 30 days after the final study drug administration.  [x] [] A male subject must not donate sperm during the treatment period and for at least 30 days after the final study drug administration.  [x] [] Male subject with a pregnant or breastfeeding partner(s) must agree to remain abstinent or use a condom for the duration of the pregnancy or time partner is breastfeeding throughout the study period and for 30 days after the final study drug administration.    Exclusion  [] [x] Subject has received investigational drug within 30 days or 5 half-lives, whichever is longer, prior to screening.  [] [x] Subject has use of RRT within 30 days prior to screening.  [] [x] Subject has a known history of eGFR < 30 mL/min per 1.73 m2 as per CKD-EPI or MDRD equation at any time before screening.  [] [x] Subject has a prior kidney transplantation.  [] [x] Subject has a known or suspected glomerulonephritis (other than Diabetic Kidney Disease).  [] [x] Subject has confirmed or treated endocarditis, or other current active infection requiring antibiotic treatment, within 30 days prior to screening.  [] [x] Subject is using prohibited medications as specified in the concomitant medication section of the protocol [Section 5.1.3.1 Prohibited and Restricted Treatment].  [] [x] Subject has a prior history of intravenous drug  abuse within 1 year prior to screening.  [] [x] Subject has a known chronic liver disorder with Child-Pugh B or C classification.  [] [x] Subject has any of the following abnormal liver or kidney function parameters (as assessed in visit 1 sample. If 1 of results of central or local laboratory testing fulfill the criterion, the subjects will be eligible.): [] Alanine aminotransferase (ALT), aspartate aminotransferase (AST) to > 2 times the upper limit of normal (ULN) or bilirubin increased to > 1.5 times the ULN. [] eGFR < 30 mL/min per 1.73 m2 as per CKD-EPI equation.  [] [x] Subject has use of left ventricular assist device (LVAD), intra-aortic balloon pump (IABP) or other cardiac devices, or catecholamines within 7 days prior to screening.  [] [x] Subject has surgery scheduled to be performed without CPB (i.e., "Off-Pump" surgery).  [] [x] Subject has surgery scheduled to be performed under conditions of circulatory arrest, including planned deep hypothermic circulatory arrest.  [] [x] Subject has surgery scheduled for aortic dissection.  [] [x] Subject has surgery for a condition that is immediately life-threatening as per the discretion of the investigator.  [] [x] Subject has surgery scheduled to correct major congenital heart defect.   [] [x] Subject has current or previous malignant disease. Subjects with a history of cancer are considered eligible if the subject has undergone therapy and the subject has been considered   disease free or progression free for at least 5 years. Subject with completely excised basal cell carcinoma or squamous cell carcinoma of the skin and completely excised cervical cancer in situ are also considered eligible.    Preoperatively at the Day of Surgery:  [] [x] Exclusion criteria 1 to 17 are applicable at this time.  [] [x] Subject has AKI (any stage) present at presurgery baseline at the discretion of the investigator.  [] [x] Subject has known or suspected  infection/sepsis at time of presurgery baseline at the discretion of investigator.    Perioperative Exclusion Criteria:  [] [x] Subject requires Extracorporeal Membrane Oxygenation (ECMO) during or after completion of surgery.  [] [x] Subject requires ventricular assist device (VAD) during or after completion of surgery.  [] [x] Subject has surgery performed "Off-Pump" at any time during surgery.    General:  [] [x] Subject has other condition, which, in the investigator's opinion, makes the subject unsuitable for study participation.  [] [x] Male subject who is pregnant or lactating or has a positive pregnancy test within 72 hours prior to screening and/or randomization, has been pregnant within 6 months before screening assessment or breastfeeding within 3 months before screening or who is planning to become pregnant within the total study period.  [] [x] Subject has a known or suspected hypersensitivity to [id number] or any components of the formulation used.  [] [x] Subject is an employee of the [employer] or the contract research organization (CRO) involved in the study.   [id number]  MOBILITY:    I HAVE NO PROBLEMS WALKING [x]  I HAVE SLIGHT PROBLEMS WALKING []  I HAVE MODERATE PROBLEMS WALKING []  I HAVE SEVERE PROBLEMS WALKING []  I AM UNABLE TO WALK  []    SELF-CARE:   I HAVE NO PROBLEMS WASING OR DRESSING MYSELF  [x]  I HAVE SLIGHT PROBLEMS WASHING OR DRESSING MYSELF  []  I HAVE MODERATE PROBLEMS WASHING OR DRESSING MYSELF []  I HAVE SEVERE PROBLEMS WASHING OR DRESSING MYSELF  []  I HAVE SEVERE PROBLEMS WASHING OR DRESSING MYSELF  []  I AM UNABLE TO WASH OR DRESS MYSELF []    USUAL ACTIVITIES: (E.G. WORK/STUDY/HOUSEWORK/FAMILY OR LEISURE ACTIVITIES.    I HAVE NO PROBLEMS DOING MY USUAL ACTIVITIES [x]  I HAVE SLIGHT PROBLEMS DOING MY USUAL ACTIVITIES []  I HAVE MODERATE PROBLEMS DOING MY USUAL ACTIVIITIES []  I HAVE SEVERE PROBLEMS DOING MY USUAL ACTIVITIES []  I AM  UNABLE TO DO MY USUAL ACTIVITIES []    PAIN /DISCOMFORT   I HAVE NO PAIN OR DISCOMFORT [x]  I HAVE SLIGHT PAIN OR DISCOMFORT []  I HAVE MODERATE PAIN OR DISCOMFORT []  I HAVE SEVERE PAIN OR DISCOMFORT []  I HAVE EXTREME PAIN OR DISCOMFORT []    ANXIETY/DEPRESSION   I AM NOT ANXIOUS OR DEPRESSED [x]  I AM SLIGHTLY ANXIOUS OR DEPRESSED []  I AM MODERATELY ANXIOUS OR DREPRESSED []  I AM SEVERELY ANXIOUS OR DEPRESSED []  I AM EXTREMELY ANXIOUS OR DEPRESSED []    SCALE OF 0-100 HOW WOULD YOU RATE TODAY?  0 IS THE WORSE AND 100 IS THE BEST HEALTH YOU CAN IMAGINE: 80                 ABNORMAL LABS   []   No change or change not clinically significant. NO FOLLOW UP REQUIRED. [x]   Change clinically significant and attributable to disease or management. NO FOLLOW UP    REQUIRED. []   Change clinically significant and possibly attributable to study medication. NO FOLLOW UP REQUIRED. []   Change clinically significant and attributable to study medication. FOLLOW UP REQUIRED. []   Apparent lab error. []   Unevaluable.  

## 2019-08-20 NOTE — Progress Notes (Signed)
Anesthesia Chart Review:  Case: [id number] Date/Time: 08/24/19 0715   Procedures:      CORONARY ARTERY BYPASS GRAFTING (CABG) (N/A Chest) - swan only     RADIAL ARTERY HARVEST (Left Arm Lower)     TRANSESOPHAGEAL ECHOCARDIOGRAM (TEE) (N/A )   Anesthesia type: General   Pre-op diagnosis: CAD   Location: [facility] OR ROOM 14 / [place] [facility]   Surgeons: Ivin Poot, MD      DISCUSSION: Patient is a 59 year old male scheduled for the above procedure.  History includes never smoker, CAD, DM1 (diagnosed ~ 59 years old; insulin pump), HTN, HLD, neuropathy, cleft palate repair. 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   Per PAT RN notes, he was advised to follow-up with Dr. Buddy Duty regarding preoperative insulin pump instructions (or decrease basal rate by 20% at [facility] the night before surgery). DM coordinator notified of insulin pump and upcoming surgery.   08/20/19 presurgical COVID-19 test is in process. Anesthesia team to evaluate on the day of surgery.   VS: BP (!) 151/87   Pulse 79   Temp 37.2 C (Oral)   Resp 18   Ht 5\' 10"  (1.778 m)   Wt 79.5 kg   SpO2 97%   BMI 25.15 kg/m   PROVIDERS: Kathyrn Lass, MD is PCP Quay Burow, MD is cardiolgoist Minette Brine, MD is endocrinologist   LABS: Labs reviewed: Acceptable for surgery. (all labs ordered are listed, but only abnormal results are displayed)  Labs Reviewed  GLUCOSE, CAPILLARY - Abnormal; Notable for the following components:      Result Value   Glucose-Capillary 153 (*)    All other components within normal limits  BLOOD GAS, ARTERIAL - Abnormal; Notable for the following components:   pH, Arterial 7.462 (*)    pO2, Arterial 128 (*)    All other components within normal limits  CBC - Abnormal; Notable for the following components:   RBC 4.18 (*)    Hemoglobin 12.8 (*)    HCT 38.9 (*)    All other components within normal limits  COMPREHENSIVE METABOLIC PANEL - Abnormal; Notable for the following components:   Glucose, Bld 161 (*)    All other components within normal limits  HEMOGLOBIN A1C - Abnormal; Notable for the following components:   Hgb A1c MFr Bld 7.3 (*)    All other components within normal limits  URINALYSIS, ROUTINE W REFLEX MICROSCOPIC - Abnormal; Notable for the following components:   Ketones, ur 5 (*)    All other components within normal limits  SURGICAL PCR SCREEN  APTT  PROTIME-INR  TYPE AND SCREEN    IMAGES: CXR 08/20/19: FINDINGS: - The cardiomediastinal silhouette is unremarkable. - There is no evidence of focal airspace disease, pulmonary edema, suspicious pulmonary nodule/mass, pleural effusion, or pneumothorax. - No acute bony abnormalities are identified. IMPRESSION: No active cardiopulmonary disease.   EKG: 08/12/19: NSR   CV: Carotid US 08/20/19: Summary:  Right Carotid: Velocities in the right ICA are consistent with a 1-39%  stenosis.  Left Carotid: Velocities in the left ICA are consistent with a 1-39%  stenosis.  Vertebrals: Bilateral vertebral arteries demonstrate antegrade flow.    Cardiac cath 08/12/19:  Ost LAD to Prox LAD lesion is 90% stenosed.  Mid LAD-1 lesion is 50% stenosed.  Mid LAD-2 lesion is 90% stenosed.  Ost Cx to Mid Cx lesion is 90% stenosed.  Ost LM to Dist LM lesion is 50% stenosed.  Prox RCA lesion is 30% stenosed.  Prox RCA to Mid RCA lesion is 30% stenosed.  The left ventricular systolic function is normal.  LV end diastolic pressure is normal.  The left ventricular ejection fraction is 55-65% by visual estimate.   Coronary CT 07/27/19: IMPRESSION: 1. Moderate obstructive CAD, CADRADS = 3. Mixed calcified and noncalcified plaque seen in the proximal portions of LAD, LCx, and RCA. CT FFR will be performed and reported separately. 2. Coronary calcium score of 647. This was 99th percentile for age and sex matched control. 3. Normal coronary origin with right dominance.   Echo 06/19/17 [facility]; [facility]  CE): SUMMARY Normal LV systolic function. LVEF 55-60%. Diastolic dysfunction, stage 1. No significant valvular disease. (Trace TR) No prior.   Past Medical History:  Diagnosis Date  . Coronary artery disease   . Diabetes mellitus without complication (HCC)   . Hyperlipidemia   . Hypertension   . Neuromuscular disorder (HCC)    neuropathy in feet  . Pneumonia     Past Surgical History:  Procedure Laterality Date  . CLEFT PALATE REPAIR    . LEFT HEART CATH AND CORONARY ANGIOGRAPHY N/A 08/12/2019   Procedure: LEFT HEART CATH AND CORONARY ANGIOGRAPHY;  Surgeon: [name], MD;  Location: [facility] INVASIVE CV LAB;  Service: Cardiovascular;  Laterality: N/A;    MEDICATIONS: . aspirin EC 81 MG tablet  . docusate sodium (COLACE) 100 MG capsule  . hydrochlorothiazide (HYDRODIURIL) 25 MG tablet  . insulin aspart (NOVOLOG) 100 UNIT/ML injection  . Insulin Human (INSULIN PUMP) SOLN  . latanoprost (XALATAN) 0.005 % ophthalmic solution  . lisinopril (PRINIVIL,ZESTRIL) 20 MG tablet  . metoprolol tartrate (LOPRESSOR) 25 MG tablet  . rosuvastatin (CRESTOR) 20 MG tablet  . timolol (TIMOPTIC) 0.5 % ophthalmic solution   . sodium chloride flush (NS) 0.9 % injection 3 mL    Shonna Chock, PA-C Surgical Short Stay/Anesthesiology [facility] Phone [phone] [facility] Phone [phone] 08/20/2019 1:22 PM

## 2019-08-20 NOTE — Progress Notes (Addendum)
Diabetic coordinator notified about pt's insulin pump and upcoming surgery.

## 2019-08-20 NOTE — Anesthesia Preprocedure Evaluation (Addendum)
Anesthesia Evaluation  Patient identified by MRN, date of birth, ID band Patient awake    Reviewed: Allergy & Precautions, H&P , NPO status , Patient's Chart, lab work & pertinent test results, reviewed documented beta blocker date and time   Airway Mallampati: II  TM Distance: >3 FB Neck ROM: Full    Dental  (+) Teeth Intact, Dental Advisory Given   Pulmonary neg pulmonary ROS,    breath sounds clear to auscultation       Cardiovascular Exercise Tolerance: Good hypertension, Pt. on medications and Pt. on home beta blockers + angina + CAD  negative cardio ROS   Rhythm:Regular Rate:Normal     Neuro/Psych negative neurological ROS  negative psych ROS   GI/Hepatic negative GI ROS, Neg liver ROS,   Endo/Other  negative endocrine ROSdiabetes, Well Controlled, Type 1, Insulin Dependent  Renal/GU negative Renal ROS  negative genitourinary   Musculoskeletal   Abdominal Normal abdominal exam  (+)   Peds  Hematology negative hematology ROS (+)   Anesthesia Other Findings   Reproductive/Obstetrics negative OB ROS                            Anesthesia Physical Anesthesia Plan  ASA: IV  Anesthesia Plan: General   Post-op Pain Management:    Induction: Intravenous  PONV Risk Score and Plan: 2 and Ondansetron and Midazolam  Airway Management Planned: Oral ETT  Additional Equipment: Arterial line, CVP, PA Cath, TEE and Ultrasound Guidance Line Placement  Intra-op Plan:   Post-operative Plan: Post-operative intubation/ventilation  Informed Consent: I have reviewed the patients History and Physical, chart, labs and discussed the procedure including the risks, benefits and alternatives for the proposed anesthesia with the patient or authorized representative who has indicated his/her understanding and acceptance.     Dental advisory given  Plan Discussed with: CRNA  Anesthesia Plan  Comments: (PAT note written 08/20/2019 by Shonna Chock, PA-C. Has insulin pump. 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 )       Anesthesia Quick Evaluation

## 2019-08-20 NOTE — Progress Notes (Signed)
PCP - Dr. [name] Cardiologist - Dr. [name] Endocrinologist: Dr. Talmage Coin Registered Dietitian: Dr. Pincus Large  PPM/ICD - Denies  Chest x-ray - 08/20/2019 EKG - 08/12/2019 Stress Test - Denies ECHO - 06/19/2017 Cardiac Cath - 08/12/2019  Sleep Study - Denies  Pt is on insulin pump.  Fasting Blood Sugar - 120 Checks Blood Sugar _4____ times a day  Pt instructed to contact endocrinologist about insulin pump adjustments before surgery.  Aspirin Instructions: Pt instructed to continue taking ASA.  ERAS Protcol - No   COVID TEST- Scheduled 08/20/2019   Coronavirus Screening  Have you experienced the following symptoms:  Cough yes/no: No Fever (>100.21F)  yes/no: No Runny nose yes/no: No Sore throat yes/no: No Difficulty breathing/shortness of breath  yes/no: No  Have you or a family member traveled in the last 14 days and where? yes/no: No   If the patient indicates "YES" to the above questions, their PAT will be rescheduled to limit the exposure to others and, the surgeon will be notified. THE PATIENT WILL NEED TO BE ASYMPTOMATIC FOR 14 DAYS.   If the patient is not experiencing any of these symptoms, the PAT nurse will instruct them to NOT bring anyone with them to their appointment since they may have these symptoms or traveled as well.   Please remind your patients and families that hospital visitation restrictions are in effect and the importance of the restrictions.     Anesthesia review: Yes, cardiac hx  Patient denies shortness of breath, fever, cough and chest pain at PAT appointment   All instructions explained to the patient, with a verbal understanding of the material. Patient agrees to go over the instructions while at home for a better understanding. Patient also instructed to self quarantine after being tested for COVID-19. The opportunity to ask questions was provided.

## 2019-08-23 MED ORDER — TRANEXAMIC ACID 1000 MG/10ML IV SOLN
1.5000 mg/kg/h | INTRAVENOUS | Status: AC
  Administered 2019-08-24: 1.5 mg/kg/h via INTRAVENOUS
  Filled 2019-08-23: qty 25

## 2019-08-23 MED ORDER — SODIUM CHLORIDE 0.9 % IV SOLN
INTRAVENOUS | Status: DC
  Filled 2019-08-23: qty 30

## 2019-08-23 MED ORDER — MAGNESIUM SULFATE 50 % IJ SOLN
40.0000 meq | INTRAMUSCULAR | Status: DC
  Filled 2019-08-23: qty 9.85

## 2019-08-23 MED ORDER — EPINEPHRINE HCL 5 MG/250ML IV SOLN IN NS
0.0000 ug/min | INTRAVENOUS | Status: DC
  Filled 2019-08-23: qty 250

## 2019-08-23 MED ORDER — DEXMEDETOMIDINE HCL IN NACL 400 MCG/100ML IV SOLN
0.1000 ug/kg/h | INTRAVENOUS | Status: AC
  Administered 2019-08-24: .5 ug/kg/h via INTRAVENOUS
  Filled 2019-08-23: qty 100

## 2019-08-23 MED ORDER — NITROGLYCERIN IN D5W 200-5 MCG/ML-% IV SOLN
2.0000 ug/min | INTRAVENOUS | Status: AC
  Administered 2019-08-24: 5 ug/min via INTRAVENOUS
  Filled 2019-08-23: qty 250

## 2019-08-23 MED ORDER — NOREPINEPHRINE 4 MG/250ML-% IV SOLN
0.0000 ug/min | INTRAVENOUS | Status: AC
  Administered 2019-08-24: 20 ug/min via INTRAVENOUS
  Filled 2019-08-23: qty 250

## 2019-08-23 MED ORDER — PHENYLEPHRINE HCL-NACL 20-0.9 MG/250ML-% IV SOLN
30.0000 ug/min | INTRAVENOUS | Status: AC
  Administered 2019-08-24: 20 ug/min via INTRAVENOUS
  Filled 2019-08-23: qty 250

## 2019-08-23 MED ORDER — TRANEXAMIC ACID (OHS) PUMP PRIME SOLUTION
2.0000 mg/kg | INTRAVENOUS | Status: DC
  Filled 2019-08-23: qty 1.59

## 2019-08-23 MED ORDER — INSULIN REGULAR(HUMAN) IN NACL 100-0.9 UT/100ML-% IV SOLN
INTRAVENOUS | Status: AC
  Administered 2019-08-24: 1.6 [IU]/h via INTRAVENOUS
  Filled 2019-08-23: qty 100

## 2019-08-23 MED ORDER — POTASSIUM CHLORIDE 2 MEQ/ML IV SOLN
80.0000 meq | INTRAVENOUS | Status: DC
  Filled 2019-08-23: qty 40

## 2019-08-23 MED ORDER — LEVOFLOXACIN IN D5W 500 MG/100ML IV SOLN
500.0000 mg | INTRAVENOUS | Status: AC
  Administered 2019-08-24: 500 mg via INTRAVENOUS
  Filled 2019-08-23: qty 100

## 2019-08-23 MED ORDER — TRANEXAMIC ACID (OHS) BOLUS VIA INFUSION
15.0000 mg/kg | INTRAVENOUS | Status: AC
  Administered 2019-08-24: 1192.5 mg via INTRAVENOUS
  Filled 2019-08-23: qty 1193

## 2019-08-23 MED ORDER — MILRINONE LACTATE IN DEXTROSE 20-5 MG/100ML-% IV SOLN
0.3000 ug/kg/min | INTRAVENOUS | Status: DC
  Filled 2019-08-23: qty 100

## 2019-08-23 MED ORDER — PLASMA-LYTE 148 IV SOLN
INTRAVENOUS | Status: AC
  Administered 2019-08-24: 500 mL
  Filled 2019-08-23: qty 2.5

## 2019-08-23 MED ORDER — VANCOMYCIN HCL 1250 MG/250ML IV SOLN
1250.0000 mg | INTRAVENOUS | Status: AC
  Administered 2019-08-24: 1250 mg via INTRAVENOUS
  Filled 2019-08-23: qty 250

## 2019-08-24 ENCOUNTER — Inpatient Hospital Stay (HOSPITAL_COMMUNITY): Payer: BC Managed Care – PPO

## 2019-08-24 ENCOUNTER — Other Ambulatory Visit: Payer: Self-pay

## 2019-08-24 ENCOUNTER — Inpatient Hospital Stay (HOSPITAL_COMMUNITY): Payer: BC Managed Care – PPO | Admitting: Anesthesiology

## 2019-08-24 ENCOUNTER — Inpatient Hospital Stay (HOSPITAL_COMMUNITY)
Admission: RE | Disposition: A | Discharge status: Home / Self Care | Payer: Self-pay | Source: Home / Self Care | Attending: Cardiothoracic Surgery

## 2019-08-24 ENCOUNTER — Inpatient Hospital Stay (HOSPITAL_COMMUNITY)
Admission: RE | Admit: 2019-08-24 | Discharge: 2019-08-30 | DRG: 236 | Disposition: A | Discharge status: Home / Self Care | Payer: BC Managed Care – PPO | Attending: Cardiothoracic Surgery | Admitting: Cardiothoracic Surgery

## 2019-08-24 ENCOUNTER — Encounter (HOSPITAL_COMMUNITY): Payer: Self-pay | Admitting: Cardiothoracic Surgery

## 2019-08-24 ENCOUNTER — Inpatient Hospital Stay (HOSPITAL_COMMUNITY): Payer: BC Managed Care – PPO | Admitting: Vascular Surgery

## 2019-08-24 DIAGNOSIS — I2510 Atherosclerotic heart disease of native coronary artery without angina pectoris: Secondary | ICD-10-CM

## 2019-08-24 DIAGNOSIS — G4700 Insomnia, unspecified: Secondary | ICD-10-CM | POA: Diagnosis not present

## 2019-08-24 DIAGNOSIS — E876 Hypokalemia: Secondary | ICD-10-CM | POA: Diagnosis not present

## 2019-08-24 DIAGNOSIS — I4891 Unspecified atrial fibrillation: Secondary | ICD-10-CM | POA: Diagnosis not present

## 2019-08-24 DIAGNOSIS — Z79899 Other long term (current) drug therapy: Secondary | ICD-10-CM | POA: Diagnosis not present

## 2019-08-24 DIAGNOSIS — Z951 Presence of aortocoronary bypass graft: Secondary | ICD-10-CM

## 2019-08-24 DIAGNOSIS — Z87730 Personal history of (corrected) cleft lip and palate: Secondary | ICD-10-CM | POA: Diagnosis not present

## 2019-08-24 DIAGNOSIS — E109 Type 1 diabetes mellitus without complications: Secondary | ICD-10-CM | POA: Diagnosis not present

## 2019-08-24 DIAGNOSIS — Z91040 Latex allergy status: Secondary | ICD-10-CM | POA: Diagnosis not present

## 2019-08-24 DIAGNOSIS — D62 Acute posthemorrhagic anemia: Secondary | ICD-10-CM | POA: Diagnosis not present

## 2019-08-24 DIAGNOSIS — E1042 Type 1 diabetes mellitus with diabetic polyneuropathy: Secondary | ICD-10-CM | POA: Diagnosis not present

## 2019-08-24 DIAGNOSIS — Z794 Long term (current) use of insulin: Secondary | ICD-10-CM

## 2019-08-24 DIAGNOSIS — Z9641 Presence of insulin pump (external) (internal): Secondary | ICD-10-CM | POA: Diagnosis present

## 2019-08-24 DIAGNOSIS — E785 Hyperlipidemia, unspecified: Secondary | ICD-10-CM | POA: Diagnosis present

## 2019-08-24 DIAGNOSIS — I25810 Atherosclerosis of coronary artery bypass graft(s) without angina pectoris: Secondary | ICD-10-CM | POA: Diagnosis not present

## 2019-08-24 DIAGNOSIS — I25110 Atherosclerotic heart disease of native coronary artery with unstable angina pectoris: Principal | ICD-10-CM | POA: Diagnosis present

## 2019-08-24 DIAGNOSIS — Z8249 Family history of ischemic heart disease and other diseases of the circulatory system: Secondary | ICD-10-CM

## 2019-08-24 DIAGNOSIS — Z880 Allergy status to penicillin: Secondary | ICD-10-CM | POA: Diagnosis not present

## 2019-08-24 DIAGNOSIS — I10 Essential (primary) hypertension: Secondary | ICD-10-CM | POA: Diagnosis not present

## 2019-08-24 DIAGNOSIS — Z09 Encounter for follow-up examination after completed treatment for conditions other than malignant neoplasm: Secondary | ICD-10-CM

## 2019-08-24 DIAGNOSIS — Z4682 Encounter for fitting and adjustment of non-vascular catheter: Secondary | ICD-10-CM | POA: Diagnosis not present

## 2019-08-24 DIAGNOSIS — J9811 Atelectasis: Secondary | ICD-10-CM | POA: Diagnosis not present

## 2019-08-24 HISTORY — PX: TEE WITHOUT CARDIOVERSION: SHX5443

## 2019-08-24 HISTORY — PX: RADIAL ARTERY HARVEST: SHX5067

## 2019-08-24 HISTORY — PX: CORONARY ARTERY BYPASS GRAFT: SHX141

## 2019-08-24 LAB — POCT I-STAT 7, (LYTES, BLD GAS, ICA,H+H)
Acid-base deficit: 2 mmol/L (ref 0.0–2.0)
Acid-base deficit: 6 mmol/L — ABNORMAL HIGH (ref 0.0–2.0)
Acid-base deficit: 2 mmol/L (ref 0.0–2.0)
Bicarbonate: 22 mmol/L (ref 20.0–28.0)
Bicarbonate: 17.9 mmol/L — ABNORMAL LOW (ref 20.0–28.0)
Bicarbonate: 23.4 mmol/L (ref 20.0–28.0)
Calcium, Ion: 1.09 mmol/L — ABNORMAL LOW (ref 1.15–1.40)
Calcium, Ion: 1.04 mmol/L — ABNORMAL LOW (ref 1.15–1.40)
Calcium, Ion: 1.15 mmol/L (ref 1.15–1.40)
HCT: 31 % — ABNORMAL LOW (ref 39.0–52.0)
HCT: 28 % — ABNORMAL LOW (ref 39.0–52.0)
HCT: 30 % — ABNORMAL LOW (ref 39.0–52.0)
Hemoglobin: 10.5 g/dL — ABNORMAL LOW (ref 13.0–17.0)
Hemoglobin: 9.5 g/dL — ABNORMAL LOW (ref 13.0–17.0)
Hemoglobin: 10.2 g/dL — ABNORMAL LOW (ref 13.0–17.0)
O2 Saturation: 99 %
O2 Saturation: 100 %
O2 Saturation: 99 %
Patient temperature: 35.8
Patient temperature: 36.4
Patient temperature: 37
Potassium: 4.3 mmol/L (ref 3.5–5.1)
Potassium: 4 mmol/L (ref 3.5–5.1)
Potassium: 4.3 mmol/L (ref 3.5–5.1)
Sodium: 142 mmol/L (ref 135–145)
Sodium: 142 mmol/L (ref 135–145)
Sodium: 140 mmol/L (ref 135–145)
TCO2: 23 mmol/L (ref 22–32)
TCO2: 19 mmol/L — ABNORMAL LOW (ref 22–32)
TCO2: 25 mmol/L (ref 22–32)
pCO2 arterial: 31.8 mmHg — ABNORMAL LOW (ref 32.0–48.0)
pCO2 arterial: 26.7 mmHg — ABNORMAL LOW (ref 32.0–48.0)
pCO2 arterial: 44.1 mmHg (ref 32.0–48.0)
pH, Arterial: 7.443 (ref 7.350–7.450)
pH, Arterial: 7.431 (ref 7.350–7.450)
pH, Arterial: 7.333 — ABNORMAL LOW (ref 7.350–7.450)
pO2, Arterial: 116 mmHg — ABNORMAL HIGH (ref 83.0–108.0)
pO2, Arterial: 160 mmHg — ABNORMAL HIGH (ref 83.0–108.0)
pO2, Arterial: 172 mmHg — ABNORMAL HIGH (ref 83.0–108.0)

## 2019-08-24 LAB — POCT I-STAT, CHEM 8
BUN: 19 mg/dL (ref 6–20)
BUN: 19 mg/dL (ref 6–20)
Calcium, Ion: 1.2 mmol/L (ref 1.15–1.40)
Calcium, Ion: 1.24 mmol/L (ref 1.15–1.40)
Chloride: 104 mmol/L (ref 98–111)
Chloride: 102 mmol/L (ref 98–111)
Creatinine, Ser: 0.8 mg/dL (ref 0.61–1.24)
Creatinine, Ser: 0.8 mg/dL (ref 0.61–1.24)
Glucose, Bld: 151 mg/dL — ABNORMAL HIGH (ref 70–99)
Glucose, Bld: 136 mg/dL — ABNORMAL HIGH (ref 70–99)
HCT: 27 % — ABNORMAL LOW (ref 39.0–52.0)
HCT: 27 % — ABNORMAL LOW (ref 39.0–52.0)
Hemoglobin: 9.2 g/dL — ABNORMAL LOW (ref 13.0–17.0)
Hemoglobin: 9.2 g/dL — ABNORMAL LOW (ref 13.0–17.0)
Potassium: 4.1 mmol/L (ref 3.5–5.1)
Potassium: 4 mmol/L (ref 3.5–5.1)
Sodium: 137 mmol/L (ref 135–145)
Sodium: 139 mmol/L (ref 135–145)
TCO2: 26 mmol/L (ref 22–32)
TCO2: 26 mmol/L (ref 22–32)

## 2019-08-24 LAB — CBC
HCT: 29.6 % — ABNORMAL LOW (ref 39.0–52.0)
HCT: 31.2 % — ABNORMAL LOW (ref 39.0–52.0)
Hemoglobin: 10.5 g/dL — ABNORMAL LOW (ref 13.0–17.0)
Hemoglobin: 11 g/dL — ABNORMAL LOW (ref 13.0–17.0)
MCH: 31.6 pg (ref 26.0–34.0)
MCH: 31.4 pg (ref 26.0–34.0)
MCHC: 35.5 g/dL (ref 30.0–36.0)
MCHC: 35.3 g/dL (ref 30.0–36.0)
MCV: 89.2 fL (ref 80.0–100.0)
MCV: 89.1 fL (ref 80.0–100.0)
Platelets: 141 10*3/uL — ABNORMAL LOW (ref 150–400)
Platelets: 165 10*3/uL (ref 150–400)
RBC: 3.32 MIL/uL — ABNORMAL LOW (ref 4.22–5.81)
RBC: 3.5 MIL/uL — ABNORMAL LOW (ref 4.22–5.81)
RDW: 12.1 % (ref 11.5–15.5)
RDW: 12.2 % (ref 11.5–15.5)
WBC: 13.4 10*3/uL — ABNORMAL HIGH (ref 4.0–10.5)
WBC: 12.3 10*3/uL — ABNORMAL HIGH (ref 4.0–10.5)
nRBC: 0 % (ref 0.0–0.2)
nRBC: 0 % (ref 0.0–0.2)

## 2019-08-24 LAB — BASIC METABOLIC PANEL
Anion gap: 10 (ref 5–15)
BUN: 18 mg/dL (ref 6–20)
CO2: 21 mmol/L — ABNORMAL LOW (ref 22–32)
Calcium: 7.7 mg/dL — ABNORMAL LOW (ref 8.9–10.3)
Chloride: 107 mmol/L (ref 98–111)
Creatinine, Ser: 0.99 mg/dL (ref 0.61–1.24)
GFR calc Af Amer: >60 mL/min (ref >60)
GFR calc non Af Amer: >60 mL/min (ref >60)
Glucose, Bld: 123 mg/dL — ABNORMAL HIGH (ref 70–99)
Potassium: 4.3 mmol/L (ref 3.5–5.1)
Sodium: 138 mmol/L (ref 135–145)

## 2019-08-24 LAB — GLUCOSE, CAPILLARY
Glucose-Capillary: 104 mg/dL — ABNORMAL HIGH (ref 70–99)
Glucose-Capillary: 105 mg/dL — ABNORMAL HIGH (ref 70–99)
Glucose-Capillary: 109 mg/dL — ABNORMAL HIGH (ref 70–99)
Glucose-Capillary: 112 mg/dL — ABNORMAL HIGH (ref 70–99)
Glucose-Capillary: 155 mg/dL — ABNORMAL HIGH (ref 70–99)
Glucose-Capillary: 244 mg/dL — ABNORMAL HIGH (ref 70–99)
Glucose-Capillary: 93 mg/dL (ref 70–99)
Glucose-Capillary: 99 mg/dL (ref 70–99)

## 2019-08-24 LAB — PREPARE RBC (CROSSMATCH)

## 2019-08-24 LAB — APTT: aPTT: 31 seconds (ref 24–36)

## 2019-08-24 LAB — PROTIME-INR
INR: 1.5 — ABNORMAL HIGH (ref 0.8–1.2)
Prothrombin Time: 17.6 seconds — ABNORMAL HIGH (ref 11.4–15.2)

## 2019-08-24 LAB — MAGNESIUM: Magnesium: 2.6 mg/dL — ABNORMAL HIGH (ref 1.7–2.4)

## 2019-08-24 LAB — ECHO INTRAOPERATIVE TEE
Height: 70 in
Weight: 2804.25 oz

## 2019-08-24 LAB — PLATELET COUNT: Platelets: 206 10*3/uL (ref 150–400)

## 2019-08-24 LAB — HEMOGLOBIN AND HEMATOCRIT, BLOOD
HCT: 28.8 % — ABNORMAL LOW (ref 39.0–52.0)
Hemoglobin: 9.8 g/dL — ABNORMAL LOW (ref 13.0–17.0)

## 2019-08-24 SURGERY — CORONARY ARTERY BYPASS GRAFTING (CABG)
Anesthesia: General | Site: Chest

## 2019-08-24 MED ORDER — METHYLPREDNISOLONE SODIUM SUCC 125 MG IJ SOLR
INTRAMUSCULAR | Status: AC
  Administered 2019-08-24: 18:00:00 75 mg via INTRAVENOUS
  Filled 2019-08-24: qty 2

## 2019-08-24 MED ORDER — CHLORHEXIDINE GLUCONATE 0.12 % MT SOLN: 15.0000 mL | OROMUCOSAL | Status: AC

## 2019-08-24 MED ORDER — FENTANYL CITRATE (PF) 250 MCG/5ML IJ SOLN
INTRAMUSCULAR | Status: AC
  Filled 2019-08-24: qty 5

## 2019-08-24 MED ORDER — CHLORHEXIDINE GLUCONATE CLOTH 2 % EX PADS
6.0000 | MEDICATED_PAD | Freq: Every day | CUTANEOUS | Status: DC
  Administered 2019-08-24 – 2019-08-26 (×2): 6 via TOPICAL

## 2019-08-24 MED ORDER — DEXMEDETOMIDINE HCL IN NACL 400 MCG/100ML IV SOLN
0.0000 ug/kg/h | INTRAVENOUS | Status: DC
  Administered 2019-08-24: 21:00:00 0.1 ug/kg/h via INTRAVENOUS
  Filled 2019-08-24: qty 100

## 2019-08-24 MED ORDER — EPHEDRINE 5 MG/ML INJ
INTRAVENOUS | Status: AC
  Filled 2019-08-24: qty 10

## 2019-08-24 MED ORDER — METOCLOPRAMIDE HCL 5 MG/ML IJ SOLN
10.0000 mg | Freq: Four times a day (QID) | INTRAMUSCULAR | Status: DC
  Administered 2019-08-24 – 2019-08-26 (×9): 10 mg via INTRAVENOUS
  Filled 2019-08-24 (×9): qty 2

## 2019-08-24 MED ORDER — INSULIN REGULAR(HUMAN) IN NACL 100-0.9 UT/100ML-% IV SOLN: INTRAVENOUS | Status: DC

## 2019-08-24 MED ORDER — LATANOPROST 0.005 % OP SOLN
1.0000 [drp] | Freq: Every day | OPHTHALMIC | Status: DC
  Administered 2019-08-24 – 2019-08-29 (×6): 1 [drp] via OPHTHALMIC
  Filled 2019-08-24: qty 2.5

## 2019-08-24 MED ORDER — MIDAZOLAM HCL (PF) 10 MG/2ML IJ SOLN
INTRAMUSCULAR | Status: AC
  Filled 2019-08-24: qty 2

## 2019-08-24 MED ORDER — SODIUM CHLORIDE (PF) 0.9 % IJ SOLN
INTRAMUSCULAR | Status: AC
  Filled 2019-08-24: qty 10

## 2019-08-24 MED ORDER — ISOSORBIDE MONONITRATE ER 30 MG PO TB24: 15.0000 mg | ORAL_TABLET | Freq: Every day | ORAL | Status: DC

## 2019-08-24 MED ORDER — SODIUM CHLORIDE 0.9% FLUSH: 3.0000 mL | INTRAVENOUS | Status: DC | PRN

## 2019-08-24 MED ORDER — METOPROLOL TARTRATE 12.5 MG HALF TABLET
ORAL_TABLET | ORAL | Status: AC
  Filled 2019-08-24: qty 1

## 2019-08-24 MED ORDER — TRAMADOL HCL 50 MG PO TABS
50.0000 mg | ORAL_TABLET | ORAL | Status: DC | PRN
  Administered 2019-08-25: 13:00:00 50 mg via ORAL
  Filled 2019-08-24: qty 1

## 2019-08-24 MED ORDER — ASPIRIN 81 MG PO CHEW: 324.0000 mg | CHEWABLE_TABLET | Freq: Every day | ORAL | Status: DC

## 2019-08-24 MED ORDER — ALBUMIN HUMAN 5 % IV SOLN: INTRAVENOUS | Status: DC | PRN

## 2019-08-24 MED ORDER — 0.9 % SODIUM CHLORIDE (POUR BTL) OPTIME
TOPICAL | Status: DC | PRN
  Administered 2019-08-24: 5000 mL

## 2019-08-24 MED ORDER — FAMOTIDINE IN NACL 20-0.9 MG/50ML-% IV SOLN
20.0000 mg | Freq: Two times a day (BID) | INTRAVENOUS | Status: AC
  Administered 2019-08-24: 20 mg via INTRAVENOUS
  Filled 2019-08-24: qty 50

## 2019-08-24 MED ORDER — SODIUM CHLORIDE 0.9% IV SOLUTION: Freq: Once | INTRAVENOUS | Status: DC

## 2019-08-24 MED ORDER — ROCURONIUM BROMIDE 10 MG/ML (PF) SYRINGE
PREFILLED_SYRINGE | INTRAVENOUS | Status: AC
  Filled 2019-08-24: qty 20

## 2019-08-24 MED ORDER — METOPROLOL TARTRATE 25 MG/10 ML ORAL SUSPENSION
12.5000 mg | Freq: Two times a day (BID) | ORAL | Status: DC
  Administered 2019-08-25: 12.5 mg
  Filled 2019-08-24: qty 5

## 2019-08-24 MED ORDER — ALBUMIN HUMAN 5 % IV SOLN
250.0000 mL | INTRAVENOUS | Status: AC | PRN
  Administered 2019-08-24 – 2019-08-25 (×3): 12.5 g via INTRAVENOUS
  Filled 2019-08-24: qty 250

## 2019-08-24 MED ORDER — METOPROLOL TARTRATE 12.5 MG HALF TABLET: 12.5000 mg | ORAL_TABLET | Freq: Once | ORAL | Status: DC

## 2019-08-24 MED ORDER — TIMOLOL MALEATE 0.5 % OP SOLN
1.0000 [drp] | Freq: Every day | OPHTHALMIC | Status: DC
  Administered 2019-08-24 – 2019-08-30 (×7): 1 [drp] via OPHTHALMIC
  Filled 2019-08-24: qty 5

## 2019-08-24 MED ORDER — FENTANYL CITRATE (PF) 250 MCG/5ML IJ SOLN
INTRAMUSCULAR | Status: DC | PRN
  Administered 2019-08-24: 150 ug via INTRAVENOUS
  Administered 2019-08-24: 50 ug via INTRAVENOUS
  Administered 2019-08-24: 150 ug via INTRAVENOUS
  Administered 2019-08-24: 250 ug via INTRAVENOUS
  Administered 2019-08-24: 100 ug via INTRAVENOUS
  Administered 2019-08-24: 250 ug via INTRAVENOUS
  Administered 2019-08-24: 50 ug via INTRAVENOUS
  Administered 2019-08-24: 100 ug via INTRAVENOUS
  Administered 2019-08-24: 150 ug via INTRAVENOUS

## 2019-08-24 MED ORDER — PROTAMINE SULFATE 10 MG/ML IV SOLN
INTRAVENOUS | Status: AC
  Filled 2019-08-24: qty 25

## 2019-08-24 MED ORDER — ACETAMINOPHEN 160 MG/5ML PO SOLN: 650.0000 mg | Freq: Once | ORAL | Status: AC

## 2019-08-24 MED ORDER — PHENYLEPHRINE 40 MCG/ML (10ML) SYRINGE FOR IV PUSH (FOR BLOOD PRESSURE SUPPORT)
PREFILLED_SYRINGE | INTRAVENOUS | Status: DC | PRN
  Administered 2019-08-24: 60 ug via INTRAVENOUS

## 2019-08-24 MED ORDER — BISACODYL 10 MG RE SUPP: 10.0000 mg | Freq: Every day | RECTAL | Status: DC

## 2019-08-24 MED ORDER — PROTAMINE SULFATE 10 MG/ML IV SOLN
INTRAVENOUS | Status: AC
  Filled 2019-08-24: qty 5

## 2019-08-24 MED ORDER — GLYCOPYRROLATE PF 0.2 MG/ML IJ SOSY
PREFILLED_SYRINGE | INTRAMUSCULAR | Status: AC
  Filled 2019-08-24: qty 1

## 2019-08-24 MED ORDER — SODIUM CHLORIDE 0.45 % IV SOLN: INTRAVENOUS | Status: DC | PRN

## 2019-08-24 MED ORDER — VANCOMYCIN HCL IN DEXTROSE 1-5 GM/200ML-% IV SOLN
1000.0000 mg | Freq: Once | INTRAVENOUS | Status: AC
  Administered 2019-08-24: 1000 mg via INTRAVENOUS
  Filled 2019-08-24: qty 200

## 2019-08-24 MED ORDER — ORAL CARE MOUTH RINSE
15.0000 mL | OROMUCOSAL | Status: DC
  Administered 2019-08-24 (×2): 15 mL via OROMUCOSAL

## 2019-08-24 MED ORDER — SODIUM CHLORIDE 0.9 % IV SOLN: INTRAVENOUS | Status: DC | PRN

## 2019-08-24 MED ORDER — ROSUVASTATIN CALCIUM 20 MG PO TABS
20.0000 mg | ORAL_TABLET | Freq: Every day | ORAL | Status: DC
  Administered 2019-08-24 – 2019-08-29 (×6): 20 mg via ORAL
  Filled 2019-08-24 (×8): qty 1

## 2019-08-24 MED ORDER — MIDAZOLAM HCL 2 MG/2ML IJ SOLN: 2.0000 mg | INTRAMUSCULAR | Status: DC | PRN

## 2019-08-24 MED ORDER — BISACODYL 5 MG PO TBEC
10.0000 mg | DELAYED_RELEASE_TABLET | Freq: Every day | ORAL | Status: DC
  Administered 2019-08-26 – 2019-08-27 (×2): 10 mg via ORAL
  Filled 2019-08-24 (×5): qty 2

## 2019-08-24 MED ORDER — PANTOPRAZOLE SODIUM 40 MG PO TBEC
40.0000 mg | DELAYED_RELEASE_TABLET | Freq: Every day | ORAL | Status: DC
  Administered 2019-08-26 – 2019-08-30 (×5): 40 mg via ORAL
  Filled 2019-08-24 (×5): qty 1

## 2019-08-24 MED ORDER — SODIUM CHLORIDE 0.9 % IV SOLN: 250.0000 mL | INTRAVENOUS | Status: DC

## 2019-08-24 MED ORDER — ROCURONIUM BROMIDE 10 MG/ML (PF) SYRINGE
PREFILLED_SYRINGE | INTRAVENOUS | Status: AC
  Filled 2019-08-24: qty 10

## 2019-08-24 MED ORDER — CHLORHEXIDINE GLUCONATE 0.12% ORAL RINSE (MEDLINE KIT): 15.0000 mL | Freq: Two times a day (BID) | OROMUCOSAL | Status: DC

## 2019-08-24 MED ORDER — SODIUM CHLORIDE 0.9 % IV SOLN: INTRAVENOUS | Status: DC

## 2019-08-24 MED ORDER — INSULIN ASPART 100 UNIT/ML ~~LOC~~ SOLN
0.0000 [IU] | SUBCUTANEOUS | Status: DC
  Administered 2019-08-24: 8 [IU] via SUBCUTANEOUS
  Administered 2019-08-25 – 2019-08-26 (×6): 4 [IU] via SUBCUTANEOUS
  Administered 2019-08-26: 8 [IU] via SUBCUTANEOUS
  Administered 2019-08-26: 09:00:00 12 [IU] via SUBCUTANEOUS

## 2019-08-24 MED ORDER — LEVOFLOXACIN IN D5W 750 MG/150ML IV SOLN
750.0000 mg | INTRAVENOUS | Status: AC
  Administered 2019-08-25: 05:00:00 750 mg via INTRAVENOUS
  Filled 2019-08-24: qty 150

## 2019-08-24 MED ORDER — CHLORHEXIDINE GLUCONATE 4 % EX LIQD: 30.0000 mL | CUTANEOUS | Status: DC

## 2019-08-24 MED ORDER — PHENYLEPHRINE 40 MCG/ML (10ML) SYRINGE FOR IV PUSH (FOR BLOOD PRESSURE SUPPORT)
PREFILLED_SYRINGE | INTRAVENOUS | Status: AC
  Filled 2019-08-24: qty 10

## 2019-08-24 MED ORDER — POTASSIUM CHLORIDE 10 MEQ/50ML IV SOLN: 10.0000 meq | INTRAVENOUS | Status: AC

## 2019-08-24 MED ORDER — SODIUM CHLORIDE (PF) 0.9 % IJ SOLN
OROMUCOSAL | Status: DC | PRN
  Administered 2019-08-24 (×3): 4 mL via TOPICAL

## 2019-08-24 MED ORDER — HEPARIN SODIUM (PORCINE) 1000 UNIT/ML IJ SOLN
INTRAMUSCULAR | Status: AC
  Filled 2019-08-24: qty 1

## 2019-08-24 MED ORDER — CHLORHEXIDINE GLUCONATE 0.12 % MT SOLN
OROMUCOSAL | Status: AC
  Administered 2019-08-24: 06:00:00 15 mL via OROMUCOSAL
  Filled 2019-08-24: qty 15

## 2019-08-24 MED ORDER — DOCUSATE SODIUM 100 MG PO CAPS
200.0000 mg | ORAL_CAPSULE | Freq: Every day | ORAL | Status: DC
  Administered 2019-08-26 – 2019-08-28 (×2): 200 mg via ORAL
  Filled 2019-08-24 (×4): qty 2

## 2019-08-24 MED ORDER — ONDANSETRON HCL 4 MG/2ML IJ SOLN
4.0000 mg | Freq: Four times a day (QID) | INTRAMUSCULAR | Status: DC | PRN
  Administered 2019-08-24 – 2019-08-25 (×3): 4 mg via INTRAVENOUS
  Filled 2019-08-24 (×3): qty 2

## 2019-08-24 MED ORDER — 0.9 % SODIUM CHLORIDE (POUR BTL) OPTIME
TOPICAL | Status: DC | PRN
  Administered 2019-08-24: 1000 mL

## 2019-08-24 MED ORDER — ORAL CARE MOUTH RINSE
15.0000 mL | Freq: Two times a day (BID) | OROMUCOSAL | Status: DC
  Administered 2019-08-24 – 2019-08-29 (×9): 15 mL via OROMUCOSAL

## 2019-08-24 MED ORDER — ACETAMINOPHEN 500 MG PO TABS
1000.0000 mg | ORAL_TABLET | Freq: Four times a day (QID) | ORAL | Status: AC
  Administered 2019-08-25 – 2019-08-29 (×9): 1000 mg via ORAL
  Filled 2019-08-24 (×9): qty 2

## 2019-08-24 MED ORDER — HEMOSTATIC AGENTS (NO CHARGE) OPTIME
TOPICAL | Status: DC | PRN
  Administered 2019-08-24 (×2): 1 via TOPICAL

## 2019-08-24 MED ORDER — HEPARIN SODIUM (PORCINE) 1000 UNIT/ML IJ SOLN
INTRAMUSCULAR | Status: DC | PRN
  Administered 2019-08-24: 26000 [IU] via INTRAVENOUS
  Administered 2019-08-24: 2000 [IU] via INTRAVENOUS

## 2019-08-24 MED ORDER — SODIUM CHLORIDE 0.9% FLUSH
3.0000 mL | Freq: Two times a day (BID) | INTRAVENOUS | Status: DC
  Administered 2019-08-25 – 2019-08-29 (×8): 3 mL via INTRAVENOUS

## 2019-08-24 MED ORDER — METOPROLOL TARTRATE 5 MG/5ML IV SOLN
2.5000 mg | INTRAVENOUS | Status: DC | PRN
  Administered 2019-08-25 – 2019-08-27 (×3): 2.5 mg via INTRAVENOUS
  Administered 2019-08-28: 5 mg via INTRAVENOUS
  Filled 2019-08-24 (×4): qty 5

## 2019-08-24 MED ORDER — STUDY - ASTELLAS - ASP1128 OR PLACEBO 50 MG/10 ML IJ VIAL
400.0000 mL/h | INTRAVENOUS | Status: DC
  Filled 2019-08-24: qty 30

## 2019-08-24 MED ORDER — OXYCODONE HCL 5 MG PO TABS
5.0000 mg | ORAL_TABLET | ORAL | Status: DC | PRN
  Administered 2019-08-24 – 2019-08-25 (×2): 10 mg via ORAL
  Filled 2019-08-24 (×2): qty 2

## 2019-08-24 MED ORDER — MORPHINE SULFATE (PF) 2 MG/ML IV SOLN
1.0000 mg | INTRAVENOUS | Status: DC | PRN
  Administered 2019-08-24 (×2): 2 mg via INTRAVENOUS
  Administered 2019-08-25 (×2): 4 mg via INTRAVENOUS
  Filled 2019-08-24 (×2): qty 2
  Filled 2019-08-24 (×2): qty 1

## 2019-08-24 MED ORDER — ACETAMINOPHEN 650 MG RE SUPP
650.0000 mg | Freq: Once | RECTAL | Status: AC
  Administered 2019-08-24: 650 mg via RECTAL

## 2019-08-24 MED ORDER — MAGNESIUM SULFATE 4 GM/100ML IV SOLN
4.0000 g | Freq: Once | INTRAVENOUS | Status: AC
  Administered 2019-08-24: 15:00:00 4 g via INTRAVENOUS
  Filled 2019-08-24: qty 100

## 2019-08-24 MED ORDER — FENTANYL CITRATE (PF) 250 MCG/5ML IJ SOLN
INTRAMUSCULAR | Status: AC
  Filled 2019-08-24: qty 20

## 2019-08-24 MED ORDER — ACETAMINOPHEN 160 MG/5ML PO SOLN
1000.0000 mg | Freq: Four times a day (QID) | ORAL | Status: AC
  Administered 2019-08-25 – 2019-08-26 (×2): 1000 mg
  Filled 2019-08-24 (×2): qty 40.6

## 2019-08-24 MED ORDER — CHLORHEXIDINE GLUCONATE 0.12 % MT SOLN
OROMUCOSAL | Status: AC
  Administered 2019-08-24: 15:00:00 15 mL via OROMUCOSAL
  Filled 2019-08-24: qty 15

## 2019-08-24 MED ORDER — NOREPINEPHRINE 4 MG/250ML-% IV SOLN: 0.0000 ug/min | INTRAVENOUS | Status: DC

## 2019-08-24 MED ORDER — NITROGLYCERIN IN D5W 200-5 MCG/ML-% IV SOLN: 0.0000 ug/min | INTRAVENOUS | Status: DC

## 2019-08-24 MED ORDER — PROTAMINE SULFATE 10 MG/ML IV SOLN
INTRAVENOUS | Status: DC | PRN
  Administered 2019-08-24: 280 mg via INTRAVENOUS

## 2019-08-24 MED ORDER — PHENYLEPHRINE HCL-NACL 20-0.9 MG/250ML-% IV SOLN: 0.0000 ug/min | INTRAVENOUS | Status: DC

## 2019-08-24 MED ORDER — CHLORHEXIDINE GLUCONATE 0.12 % MT SOLN
15.0000 mL | Freq: Once | OROMUCOSAL | Status: AC
  Administered 2019-08-24: 15 mL via OROMUCOSAL

## 2019-08-24 MED ORDER — MIDAZOLAM HCL 5 MG/5ML IJ SOLN
INTRAMUSCULAR | Status: DC | PRN
  Administered 2019-08-24 (×3): 2 mg via INTRAVENOUS
  Administered 2019-08-24: 1 mg via INTRAVENOUS

## 2019-08-24 MED ORDER — STUDY - ASTELLAS - ASP1128 OR PLACEBO 50 MG/10 ML IJ VIAL
400.0000 mL/h | Freq: Once | INTRAVENOUS | Status: AC
  Administered 2019-08-24: 21:00:00 400 mL/h via INTRAVENOUS
  Filled 2019-08-24: qty 30

## 2019-08-24 MED ORDER — LACTATED RINGERS IV SOLN: INTRAVENOUS | Status: DC

## 2019-08-24 MED ORDER — LIDOCAINE 2% (20 MG/ML) 5 ML SYRINGE
INTRAMUSCULAR | Status: AC
  Filled 2019-08-24: qty 5

## 2019-08-24 MED ORDER — ROCURONIUM BROMIDE 10 MG/ML (PF) SYRINGE
PREFILLED_SYRINGE | INTRAVENOUS | Status: DC | PRN
  Administered 2019-08-24: 50 mg via INTRAVENOUS
  Administered 2019-08-24: 30 mg via INTRAVENOUS
  Administered 2019-08-24: 70 mg via INTRAVENOUS
  Administered 2019-08-24: 50 mg via INTRAVENOUS

## 2019-08-24 MED ORDER — METOPROLOL TARTRATE 12.5 MG HALF TABLET
12.5000 mg | ORAL_TABLET | Freq: Two times a day (BID) | ORAL | Status: DC
  Administered 2019-08-25: 12.5 mg via ORAL
  Filled 2019-08-24 (×2): qty 1

## 2019-08-24 MED ORDER — ASPIRIN EC 325 MG PO TBEC
325.0000 mg | DELAYED_RELEASE_TABLET | Freq: Every day | ORAL | Status: DC
  Administered 2019-08-26: 09:00:00 325 mg via ORAL
  Filled 2019-08-24 (×2): qty 1

## 2019-08-24 MED ORDER — NITROGLYCERIN IN D5W 200-5 MCG/ML-% IV SOLN: 7.0000 ug/min | INTRAVENOUS | Status: DC

## 2019-08-24 MED ORDER — LACTATED RINGERS IV SOLN: INTRAVENOUS | Status: DC | PRN

## 2019-08-24 MED ORDER — DEXTROSE 50 % IV SOLN: 0.0000 mL | INTRAVENOUS | Status: DC | PRN

## 2019-08-24 MED ORDER — PROPOFOL 10 MG/ML IV BOLUS
INTRAVENOUS | Status: AC
  Filled 2019-08-24: qty 40

## 2019-08-24 MED ORDER — LACTATED RINGERS IV SOLN: 500.0000 mL | Freq: Once | INTRAVENOUS | Status: DC | PRN

## 2019-08-24 MED ORDER — PROPOFOL 10 MG/ML IV BOLUS
INTRAVENOUS | Status: DC | PRN
  Administered 2019-08-24: 70 mg via INTRAVENOUS
  Administered 2019-08-24: 60 mg via INTRAVENOUS
  Administered 2019-08-24: 50 mg via INTRAVENOUS
  Administered 2019-08-24: 20 mg via INTRAVENOUS

## 2019-08-24 MED ORDER — METHYLPREDNISOLONE SODIUM SUCC 125 MG IJ SOLR: 75.0000 mg | Freq: Once | INTRAMUSCULAR | Status: AC

## 2019-08-24 SURGICAL SUPPLY — 138 items
ADAPTER CARDIO PERF ANTE/RETRO (ADAPTER) ×5 IMPLANT
APPLIER CLIP 9.375 SM OPEN (CLIP) ×5
BAG DECANTER FOR FLEXI CONT (MISCELLANEOUS) ×5 IMPLANT
BANDAGE ESMARK 6X9 LF (GAUZE/BANDAGES/DRESSINGS) ×3 IMPLANT
BASKET HEART  (ORDER IN 25'S) (MISCELLANEOUS) ×1
BASKET HEART (ORDER IN 25'S) (MISCELLANEOUS) ×1
BASKET HEART (ORDER IN 25S) (MISCELLANEOUS) ×3 IMPLANT
BLADE CLIPPER SURG (BLADE) IMPLANT
BLADE NEEDLE 3 SS STRL (BLADE) ×4 IMPLANT
BLADE NEEDLE 3MM SS STRL (BLADE) ×1
BLADE STERNUM SYSTEM 6 (BLADE) ×5 IMPLANT
BLADE SURG 10 STRL SS (BLADE) ×5 IMPLANT
BLADE SURG 12 STRL SS (BLADE) ×5 IMPLANT
BLADE SURG 15 STRL LF DISP TIS (BLADE) ×3 IMPLANT
BLADE SURG 15 STRL SS (BLADE) ×2
BNDG ELASTIC 4X5.8 VLCR STR LF (GAUZE/BANDAGES/DRESSINGS) ×10 IMPLANT
BNDG ELASTIC 6X5.8 VLCR STR LF (GAUZE/BANDAGES/DRESSINGS) ×5 IMPLANT
BNDG ESMARK 6X9 LF (GAUZE/BANDAGES/DRESSINGS) ×5
BNDG GAUZE ELAST 4 BULKY (GAUZE/BANDAGES/DRESSINGS) ×10 IMPLANT
CANISTER SUCT 3000ML PPV (MISCELLANEOUS) ×5 IMPLANT
CANNULA GUNDRY RCSP 15FR (MISCELLANEOUS) ×5 IMPLANT
CATH CPB KIT VANTRIGT (MISCELLANEOUS) ×5 IMPLANT
CATH ROBINSON RED A/P 18FR (CATHETERS) IMPLANT
CATH THORACIC 28FR RT ANG (CATHETERS) ×5 IMPLANT
CLIP APPLIE 9.375 SM OPEN (CLIP) ×3 IMPLANT
CLIP FOGARTY SPRING 6M (CLIP) ×5 IMPLANT
CLIP VESOCCLUDE MED 24/CT (CLIP) ×5 IMPLANT
CLIP VESOCCLUDE SM WIDE 24/CT (CLIP) ×10 IMPLANT
COUNTER NEEDLE 20 DBL MAG RED (NEEDLE) ×5 IMPLANT
COVER MAYO STAND STRL (DRAPES) ×15 IMPLANT
CUFF TOURN SGL QUICK 18X4 (TOURNIQUET CUFF) ×5 IMPLANT
DEFOGGER ANTIFOG KIT (MISCELLANEOUS) ×5 IMPLANT
DERMABOND ADVANCED (GAUZE/BANDAGES/DRESSINGS) ×4
DERMABOND ADVANCED .7 DNX12 (GAUZE/BANDAGES/DRESSINGS) ×6 IMPLANT
DRAIN CHANNEL 32F RND 10.7 FF (WOUND CARE) ×5 IMPLANT
DRAPE CARDIOVASCULAR INCISE (DRAPES) ×2
DRAPE HALF SHEET 40X57 (DRAPES) ×5 IMPLANT
DRAPE SLUSH/WARMER DISC (DRAPES) ×5 IMPLANT
DRAPE SRG 135X102X78XABS (DRAPES) ×3 IMPLANT
DRSG AQUACEL AG ADV 3.5X14 (GAUZE/BANDAGES/DRESSINGS) ×5 IMPLANT
ELECT BLADE 4.0 EZ CLEAN MEGAD (MISCELLANEOUS) ×5
ELECT BLADE 6.5 EXT (BLADE) ×5 IMPLANT
ELECT CAUTERY BLADE 6.4 (BLADE) ×5 IMPLANT
ELECT REM PT RETURN 9FT ADLT (ELECTROSURGICAL) ×10
ELECTRODE BLDE 4.0 EZ CLN MEGD (MISCELLANEOUS) ×3 IMPLANT
ELECTRODE REM PT RTRN 9FT ADLT (ELECTROSURGICAL) ×6 IMPLANT
FELT TEFLON 1X6 (MISCELLANEOUS) ×5 IMPLANT
GAUZE SPONGE 4X4 12PLY STRL (GAUZE/BANDAGES/DRESSINGS) ×15 IMPLANT
GEL ULTRASOUND 20GR AQUASONIC (MISCELLANEOUS) ×5 IMPLANT
GLOVE BIO SURGEON STRL SZ7.5 (GLOVE) IMPLANT
GLOVE BIO SURGEON STRL SZ8.5 (GLOVE) ×5 IMPLANT
GLOVE BIOGEL PI IND STRL 6 (GLOVE) ×6 IMPLANT
GLOVE BIOGEL PI IND STRL 7.0 (GLOVE) ×6 IMPLANT
GLOVE BIOGEL PI IND STRL 7.5 (GLOVE) ×15 IMPLANT
GLOVE BIOGEL PI IND STRL 8 (GLOVE) ×3 IMPLANT
GLOVE BIOGEL PI INDICATOR 6 (GLOVE) ×4
GLOVE BIOGEL PI INDICATOR 7.0 (GLOVE) ×4
GLOVE BIOGEL PI INDICATOR 7.5 (GLOVE) ×10
GLOVE BIOGEL PI INDICATOR 8 (GLOVE) ×2
GLOVE SURG SS PI 6.5 STRL IVOR (GLOVE) ×5 IMPLANT
GLOVE SURG SS PI 7.0 STRL IVOR (GLOVE) ×10 IMPLANT
GLOVE SURG SS PI 7.5 STRL IVOR (GLOVE) ×35 IMPLANT
GOWN STRL REUS W/ TWL LRG LVL3 (GOWN DISPOSABLE) ×30 IMPLANT
GOWN STRL REUS W/TWL LRG LVL3 (GOWN DISPOSABLE) ×20
HEMOSTAT POWDER SURGIFOAM 1G (HEMOSTASIS) ×15 IMPLANT
HEMOSTAT SURGICEL 2X14 (HEMOSTASIS) ×5 IMPLANT
INSERT FOGARTY XLG (MISCELLANEOUS) IMPLANT
KIT BASIN OR (CUSTOM PROCEDURE TRAY) ×5 IMPLANT
KIT SUCTION CATH 14FR (SUCTIONS) ×20 IMPLANT
KIT TURNOVER KIT B (KITS) ×5 IMPLANT
KIT VASOVIEW HEMOPRO 2 VH 4000 (KITS) ×5 IMPLANT
LEAD PACING MYOCARDI (MISCELLANEOUS) ×10 IMPLANT
LOOP VESSEL MAXI BLUE (MISCELLANEOUS) ×5 IMPLANT
MARKER GRAFT CORONARY BYPASS (MISCELLANEOUS) ×15 IMPLANT
NS IRRIG 1000ML POUR BTL (IV SOLUTION) ×25 IMPLANT
PACK E OPEN HEART (SUTURE) ×5 IMPLANT
PACK OPEN HEART (CUSTOM PROCEDURE TRAY) ×5 IMPLANT
PAD ARMBOARD 7.5X6 YLW CONV (MISCELLANEOUS) ×10 IMPLANT
PAD CAST 4YDX4 CTTN HI CHSV (CAST SUPPLIES) ×3 IMPLANT
PAD ELECT DEFIB RADIOL ZOLL (MISCELLANEOUS) ×5 IMPLANT
PADDING CAST COTTON 4X4 STRL (CAST SUPPLIES) ×2
PENCIL BUTTON HOLSTER BLD 10FT (ELECTRODE) ×5 IMPLANT
POSITIONER HEAD DONUT 9IN (MISCELLANEOUS) ×5 IMPLANT
PUNCH AORTIC ROTATE  4.5MM 8IN (MISCELLANEOUS) ×5 IMPLANT
PUNCH AORTIC ROTATE 4.0MM (MISCELLANEOUS) IMPLANT
PUNCH AORTIC ROTATE 4.5MM 8IN (MISCELLANEOUS) ×5 IMPLANT
PUNCH AORTIC ROTATE 5MM 8IN (MISCELLANEOUS) IMPLANT
SET CARDIOPLEGIA MPS 5001102 (MISCELLANEOUS) ×5 IMPLANT
SHEARS HARMONIC 9CM CVD (BLADE) IMPLANT
SHEARS HARMONIC STRL 23CM (MISCELLANEOUS) IMPLANT
SLEEVE SURGEON STRL (DRAPES) ×5 IMPLANT
SPONGE LAP 18X18 X RAY DECT (DISPOSABLE) ×15 IMPLANT
SPONGE LAP 4X18 RFD (DISPOSABLE) ×10 IMPLANT
STOPCOCK 4 WAY LG BORE MALE ST (IV SETS) ×5 IMPLANT
SURGIFLO W/THROMBIN 8M KIT (HEMOSTASIS) ×5 IMPLANT
SUT BONE WAX W31G (SUTURE) ×5 IMPLANT
SUT MNCRL AB 4-0 PS2 18 (SUTURE) IMPLANT
SUT PROLENE 3 0 SH DA (SUTURE) IMPLANT
SUT PROLENE 3 0 SH1 36 (SUTURE) IMPLANT
SUT PROLENE 4 0 RB 1 (SUTURE) ×6
SUT PROLENE 4 0 SH DA (SUTURE) ×5 IMPLANT
SUT PROLENE 4-0 RB1 .5 CRCL 36 (SUTURE) ×9 IMPLANT
SUT PROLENE 5 0 C 1 36 (SUTURE) ×25 IMPLANT
SUT PROLENE 6 0 C 1 30 (SUTURE) ×10 IMPLANT
SUT PROLENE 6 0 CC (SUTURE) ×50 IMPLANT
SUT PROLENE 8 0 BV175 6 (SUTURE) ×10 IMPLANT
SUT PROLENE BLUE 7 0 (SUTURE) ×5 IMPLANT
SUT SILK  1 MH (SUTURE)
SUT SILK 1 MH (SUTURE) IMPLANT
SUT SILK 2 0 (SUTURE) ×2
SUT SILK 2 0 SH CR/8 (SUTURE) ×5 IMPLANT
SUT SILK 2-0 18XBRD TIE 12 (SUTURE) ×3 IMPLANT
SUT SILK 3 0 SH CR/8 (SUTURE) IMPLANT
SUT SILK 4 0 (SUTURE) ×2
SUT SILK 4-0 18XBRD TIE 12 (SUTURE) ×3 IMPLANT
SUT STEEL 6MS V (SUTURE) ×5 IMPLANT
SUT STEEL SZ 6 DBL 3X14 BALL (SUTURE) ×5 IMPLANT
SUT VIC AB 1 CTX 36 (SUTURE) ×4
SUT VIC AB 1 CTX36XBRD ANBCTR (SUTURE) ×6 IMPLANT
SUT VIC AB 2-0 CT1 27 (SUTURE) ×4
SUT VIC AB 2-0 CT1 TAPERPNT 27 (SUTURE) ×6 IMPLANT
SUT VIC AB 2-0 CTX 27 (SUTURE) IMPLANT
SUT VIC AB 3-0 SH 27 (SUTURE)
SUT VIC AB 3-0 SH 27X BRD (SUTURE) IMPLANT
SUT VIC AB 3-0 X1 27 (SUTURE) ×10 IMPLANT
SYR 50ML SLIP (SYRINGE) IMPLANT
SYSTEM SAHARA CHEST DRAIN ATS (WOUND CARE) ×5 IMPLANT
TAPE CLOTH SURG 4X10 WHT LF (GAUZE/BANDAGES/DRESSINGS) ×5 IMPLANT
TAPE PAPER 2X10 WHT MICROPORE (GAUZE/BANDAGES/DRESSINGS) ×5 IMPLANT
TOWEL GREEN STERILE (TOWEL DISPOSABLE) ×5 IMPLANT
TOWEL GREEN STERILE FF (TOWEL DISPOSABLE) ×5 IMPLANT
TRAY CATH LUMEN 1 20CM STRL (SET/KITS/TRAYS/PACK) ×5 IMPLANT
TRAY FOLEY SLVR 16FR LF STAT (SET/KITS/TRAYS/PACK) ×5 IMPLANT
TRAY FOLEY SLVR 16FR TEMP STAT (SET/KITS/TRAYS/PACK) ×5 IMPLANT
TUBING ART PRESS 48 MALE/FEM (TUBING) ×10 IMPLANT
TUBING LAP HI FLOW INSUFFLATIO (TUBING) ×10 IMPLANT
UNDERPAD 30X30 (UNDERPADS AND DIAPERS) ×5 IMPLANT
WATER STERILE IRR 1000ML POUR (IV SOLUTION) ×10 IMPLANT

## 2019-08-24 NOTE — Anesthesia Procedure Notes (Signed)
Central Venous Catheter Insertion Performed by: Shelton Silvas, MD, anesthesiologist Start/End2/16/2021 6:45 AM, 08/24/2019 6:48 AM Patient location: Pre-op. Preanesthetic checklist: patient identified, IV checked, site marked, risks and benefits discussed, surgical consent, monitors and equipment checked, pre-op evaluation, timeout performed and anesthesia consent Hand hygiene performed  and maximum sterile barriers used  PA cath was placed.Swan type:thermodilution Procedure performed without using ultrasound guided technique. Attempts: 1 Patient tolerated the procedure well with no immediate complications.

## 2019-08-24 NOTE — Op Note (Signed)
NAME: Harry, Moore MEDICAL RECORD EP:[id number] ACCOUNT [id number] DATE OF BIRTH:1961/03/23 FACILITY: [facility] LOCATION: [facility] PHYSICIAN:Macedonio Scallon VAN TRIGT III, MD  OPERATIVE REPORT  DATE OF PROCEDURE:  08/24/2019  OPERATIONS: 1.  Coronary artery bypass grafting x3 (left internal mammary artery to left anterior descending, left radial artery free graft to obtuse marginal 1, saphenous vein graft to diagonal). 2.  Endoscopic harvest of right leg greater saphenous vein. 3.  Endoscopic harvest of left arm radial artery.  SURGEON:  Ivin Poot, MD  ASSISTANT:  Jadene Pierini, PA-C.  ANESTHESIA:  General by Dr. Suann Larry.  PREOPERATIVE DIAGNOSES:   1.  Severe multivessel coronary artery disease with accelerating angina, normal left ventricular function.   2.  Type 1 diabetes on insulin pump.  POSTOPERATIVE DIAGNOSES:   1.  Severe multivessel coronary artery disease with accelerating angina, normal left ventricular function.   2.  Type 1 diabetes on insulin pump.  DESCRIPTION OF PROCEDURE:  After informed consent had been documented in the preoperative holding and the proper site surgically marked and all questions addressed, the patient was brought to the operating room.  General anesthesia was induced under  invasive hemodynamic monitoring.  The patient remained stable.  A transesophageal echo probe was placed, which showed preoperative normal LV function with no significant valvular disease.  The patient was prepped and draped as a sterile field, including  the left arm.  A proper time-out was performed.  A sternal incision was made as the left radial artery was harvested endoscopically from the left arm.  Before removing the artery, documentation of a normal palmar Doppler signal was made with a vascular  bulldog on the radial artery.  The left internal mammary artery was harvested as a pedicle graft from its origin at the subclavian vessels.  It was a 1.5 mm vessel with good  flow.  The sternal retractor was placed and the pericardium was opened and suspended.  After the radial artery  was harvested and the saphenous vein was harvested, the patient was heparinized and pursestrings were placed in the ascending aorta and right atrium.  The patient was cannulated and placed on bypass.  The coronaries were identified for grafting.  The  LAD, diagonal and OM were found to be adequate targets.  The RCA had no significant disease.  Cardioplegia cannulas were placed for both antegrade and retrograde cold blood cardioplegia and the patient was cooled to 32 degrees.  The aortic crossclamp was applied, and a liter of cold blood cardioplegia was delivered in split doses between the  antegrade aortic and retrograde coronary sinus catheters.  There was good cardioplegic arrest and supple temperature dropped less than 12 degrees.  Cardioplegia was delivered every 20 minutes.  The distal coronary anastomoses were performed.  The first distal anastomosis was the OM1 branch of the left circumflex.  The left radial artery was sewn end-to-side with running 8-0 Prolene and there was good flow through the graft.  Cardioplegia was  redosed.  The second distal anastomosis was to the diagonal branch of the LAD.  This was a 1.5 mm vessel, proximal 80% stenosis.  Reverse saphenous vein was sewn end-to-side with running 7-0 Prolene with good flow through the graft.  Cardioplegia was redosed.  The third distal anastomosis was the mid-LAD.  There was a proximal 90% stenosis.  The left IMA pedicle was brought through an opening in the left lateral pericardium and was brought down onto the LAD and sewn end-to-side with running 8-0 Prolene.  There  was good flow through the anastomosis after briefly releasing the pedicle bulldog on the mammary artery.  The bulldog was reapplied and the pedicle was secured to the epicardium.  Cardioplegia was redosed.  While the crossclamp was still in place, a single  proximal anastomosis using the vein was constructed using a 4.5 mm punch and running 6-0 Prolene.  Air was vented from the coronaries with a dose of retrograde warm blood cardioplegia and the crossclamp  was removed.  The vein graft was opened after it was deaired and had good flow.  The radial artery proximal anastomosis was then placed end-to-side to the hood of the diagonal vein using running 7-0 Prolene.  There was good flow through both of the grafts and  hemostasis was documented.  The patient was rewarmed and reperfused.  Temporary pacing wires were applied.  The lung was reexpanded.  The patient was then weaned off cardiopulmonary bypass without difficulty.  Cardiac output was 4 L per minute.  Protamine was administered without  adverse reaction.  The echo showed preservation of LV function.  There was good hemostasis.  The anterior mediastinal fat was closed over the aorta and vein grafts.  The anterior mediastinum and left pleural chest tube were placed and brought out through  separate incisions.  The sternum was closed with interrupted wire.   The patient remained stable.  The pectoralis fascia was closed with a running #1 Vicryl.  Subcutaneous and skin layers were closed with running Vicryl and sterile dressings were  applied.  Total cardiopulmonary bypass time was 151 minutes.  VN/NUANCE  D:08/24/2019 T:08/24/2019 JOB:[id number]

## 2019-08-24 NOTE — Research (Addendum)
[facility] Research Study  Visit 2  Visit 2 labs collected @ [id number] Urine collected @ 0800  Post surgery urine collected @ 1645  Nephro check #1 drawn @ 1645 resulted @1728 - 0.09 Nephro check #2 drawn@ 1850 result @ 1936- 0.39  Pre Infusion labs collected @ 2040 Infusion 1 given @ 2043 EOI labs collected @ 2105 2-4 hour labs collected @ 2305 4-12 hour infusion labs collected @ 0815 08/25/19

## 2019-08-24 NOTE — Progress Notes (Signed)
  Echocardiogram Echocardiogram Transesophageal has been performed.  Harry Moore A Harry Moore 08/24/2019, 9:00 AM

## 2019-08-24 NOTE — Progress Notes (Signed)
This note also relates to the following rows which could not be included: SpO2 - Cannot attach notes to unvalidated device data  Rapid weaning protocol  

## 2019-08-24 NOTE — Brief Op Note (Signed)
08/24/2019  8:40 AM  PATIENT:  Harry Moore  59 y.o. male  PRE-OPERATIVE DIAGNOSIS:  CORONARY ARTERY DISEASE  POST-OPERATIVE DIAGNOSIS:  CORONARY ARTERY DISEASE  PROCEDURE:  Procedure(s) with comments: CORONARY ARTERY BYPASS GRAFTING (CABG) times three, using left radial atery harvested endoscopically and right greater saphenous vein harvested endoscopically and left internal mammary artery. (N/A) - swan only RADIAL ARTERY HARVEST (Left) TRANSESOPHAGEAL ECHOCARDIOGRAM (TEE) (N/A) LIMA-LAD LEFT RADIAL -OM1 SVG-DIAG  SURGEON:  Surgeon(s) and Role:    Kerin Perna, MD - Primary  PHYSICIAN ASSISTANT:WAYNE GOLD PA-C  ANESTHESIA:   general  EBL:  300 mL   BLOOD ADMINISTERED:none  DRAINS: 3  Chest Tube(s) in the LEFT CHEST AND MEDIASTINUM   LOCAL MEDICATIONS USED:  NONE  SPECIMEN:  No Specimen  DISPOSITION OF SPECIMEN:  N/A  COUNTS:  YES  TOURNIQUET:   Total Tourniquet Time Documented: Upper Arm (Left) - 48 minutes Total: Upper Arm (Left) - 48 minutes   DICTATION: .Other Dictation: Dictation Number PENDING  PLAN OF CARE: Admit to inpatient   PATIENT DISPOSITION:  ICU - intubated and hemodynamically stable.   Delay start of Pharmacological VTE agent (>24hrs) due to surgical blood loss or risk of bleeding: yes

## 2019-08-24 NOTE — Anesthesia Procedure Notes (Signed)
Central Venous Catheter Insertion Performed by: Effie Berkshire, MD, anesthesiologist Start/End2/16/2021 6:35 AM, 08/24/2019 6:45 AM Patient location: Pre-op. Preanesthetic checklist: patient identified, IV checked, site marked, risks and benefits discussed, surgical consent, monitors and equipment checked, pre-op evaluation, timeout performed and anesthesia consent Position: Trendelenburg Lidocaine 1% used for infiltration and patient sedated Hand hygiene performed , maximum sterile barriers used  and Seldinger technique used Catheter size: 9 Fr Total catheter length 10. Central line was placed.MAC introducer Swan type:thermodilution PA Cath depth:50 Procedure performed using ultrasound guided technique. Ultrasound Notes:anatomy identified, needle tip was noted to be adjacent to the nerve/plexus identified, no ultrasound evidence of intravascular and/or intraneural injection and image(s) printed for medical record Attempts: 1 Following insertion, line sutured and dressing applied. Post procedure assessment: blood return through all ports, free fluid flow and no air  Patient tolerated the procedure well with no immediate complications.

## 2019-08-24 NOTE — Progress Notes (Signed)
Patient ID: Harry Moore, male   DOB: [birth year], 59 y.o.   MRN: [id number] EVENING ROUNDS NOTE :     [address]       [place] [address]             [phone]                 Day of Surgery Procedure(s) (LRB): CORONARY ARTERY BYPASS GRAFTING (CABG) times three, using left radial atery harvested endoscopically and right greater saphenous vein harvested endoscopically and left internal mammary artery. (N/A) RADIAL ARTERY HARVEST (Left) TRANSESOPHAGEAL ECHOCARDIOGRAM (TEE) (N/A)  Total Length of Stay:  LOS: 0 days  BP 108/67   Pulse 68   Temp (!) 97.5 F (36.4 C)   Resp (!) 23   Ht 5\' 10"  (1.778 m)   Wt 79.5 kg   SpO2 100%   BMI 25.15 kg/m   .Intake/Output      02/15 0701 - 02/16 0700 02/16 0701 - 02/17 0700   I.V. (mL/kg)  2971.1 (37.4)   Blood  150   IV Piggyback  283   Total Intake(mL/kg)  3404 (42.8)   Urine (mL/kg/hr)  1010 (1.1)   Blood  300   Chest Tube  60   Total Output  1370   Net  +2034          . sodium chloride Stopped (08/24/19 1434)  . [START ON 08/25/2019] sodium chloride    . sodium chloride 10 mL/hr at 08/24/19 1507  . albumin human    . dexmedetomidine (PRECEDEX) IV infusion 0.6 mcg/kg/hr (08/24/19 1500)  . famotidine (PEPCID) IV 100 mL/hr at 08/24/19 1500  . insulin Stopped (08/24/19 1456)  . lactated ringers    . lactated ringers    . lactated ringers 20 mL/hr at 08/24/19 1500  . [START ON 08/25/2019] levofloxacin (LEVAQUIN) IV    . magnesium sulfate 4 g (08/24/19 1506)  . nitroGLYCERIN    . nitroGLYCERIN 5 mcg/min (08/24/19 1500)  . norepinephrine (LEVOPHED) Adult infusion Stopped (08/24/19 1432)  . phenylephrine (NEO-SYNEPHRINE) Adult infusion Stopped (08/24/19 1435)  . vancomycin       Lab Results  Component Value Date   WBC 13.4 (H) 08/24/2019   HGB 10.5 (L) 08/24/2019   HCT 29.6 (L) 08/24/2019   PLT 141 (L) 08/24/2019   GLUCOSE 136 (H) 08/24/2019   CHOL 139 08/05/2019   TRIG 76 08/05/2019   HDL 41  08/05/2019   LDLCALC 83 08/05/2019   ALT 23 08/20/2019   AST 21 08/20/2019   NA 139 08/24/2019   K 4.0 08/24/2019   CL 102 08/24/2019   CREATININE 0.80 08/24/2019   BUN 19 08/24/2019   CO2 25 08/20/2019   INR 1.5 (H) 08/24/2019   HGBA1C 7.3 (H) 08/20/2019   Surgery today Not bleeding Extubated  Neuro intact Left hand neurovascular intact   10/18/2019 MD  Beeper [phone] Office [phone] 08/24/2019 6:16 PM

## 2019-08-24 NOTE — Procedures (Signed)
Extubation Procedure Note  Patient Details:   Name: Harry Moore DOB: June 09, 1961 MRN: [id number]   Airway Documentation:    Vent end date: 08/24/19 Vent end time: 1800   Evaluation  O2 sats: stable throughout Complications: No apparent complications Patient did tolerate procedure well. Bilateral Breath Sounds: Clear, Diminished   Yes   Patient extubated per order to 4L [place] with no apparent complications. Patient achieved NIF of -34 and VC of 1.4L with good effort. MD was notified that patient did not have cuff leak at 16:48. Solumedrol was ordered and RT ordered to proceed with extubation at 18:00. Patient is alert and oriented and is able to speak. Clear BBS with no stridor noted. Vitals are stable. RT will continue to monitor.   Dalores Weger Lajuana Ripple 08/24/2019, 6:27 PM

## 2019-08-24 NOTE — Transfer of Care (Signed)
Immediate Anesthesia Transfer of Care Note  Patient: Harry Moore  Procedure(s) Performed: CORONARY ARTERY BYPASS GRAFTING (CABG) times three, using left radial atery harvested endoscopically and right greater saphenous vein harvested endoscopically and left internal mammary artery. (N/A Chest) RADIAL ARTERY HARVEST (Left Arm Lower) TRANSESOPHAGEAL ECHOCARDIOGRAM (TEE) (N/A )  Patient Location: SICU  Anesthesia Type:General  Level of Consciousness: Patient remains intubated per anesthesia plan  Airway & Oxygen Therapy: Patient remains intubated per anesthesia plan and Patient placed on Ventilator (see vital sign flow sheet for setting)  Post-op Assessment: Report given to RN and Post -op Vital signs reviewed and stable  Post vital signs: Reviewed and stable  Last Vitals:  Vitals Value Taken Time  BP 94/68 08/24/19 1500  Temp 35.7 C 08/24/19 1505  Pulse 89 08/24/19 1505  Resp 12 08/24/19 1505  SpO2 100 % 08/24/19 1505  Vitals shown include unvalidated device data.  Last Pain:  Vitals:   08/24/19 0609  PainSc: 0-No pain         Complications: No apparent anesthesia complications

## 2019-08-24 NOTE — H&P (Signed)
PCP is [name], MD Referring Provider is No ref. provider found  No chief complaint on file.  Patient examined, images of coronary angiograms personally reviewed and discussed with patient. 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  Recent cardiac catheterization by Dr. [name] demonstrates high-grade stenosis of the proximal LAD and OM1.  The RCA has no significant disease.  There is a small diagonal between lesions in the proximal LAD.  LVEDP is normal.  EF by ventriculogram is normal.  The patient has taken good care of his diabetes over the years and is now on a insulin pump with a sensor.  He has some neuropathy in his lower extremities but no evidence of peripheral vascular disease, diabetic ulcers, retinopathy or renal disease.  Lipid panel checked by Dr. [name] shows LDL 84 on Crestor 20 mg.  No history of thoracic trauma, chronic pulmonary disease, smoking, or carotid disease. His father had CABG at age 97 as well.  Past Medical History:  Diagnosis Date  . Coronary artery disease   . Diabetes mellitus without complication (HCC)   . Hyperlipidemia   . Hypertension   . Neuromuscular disorder (HCC)    neuropathy in feet  . Pneumonia     Past Surgical History:  Procedure Laterality Date  . CLEFT PALATE REPAIR    . LEFT HEART CATH AND CORONARY ANGIOGRAPHY N/A 08/12/2019   Procedure: LEFT HEART CATH AND CORONARY ANGIOGRAPHY;  Surgeon: [name], MD;  Location: [facility];  Service: Cardiovascular;  Laterality: N/A;    History reviewed. No pertinent family history.  Social History Social History   Tobacco Use  . Smoking status: Never Smoker  . Smokeless tobacco: Never Used  Substance Use Topics  . Alcohol use: Yes    Comment: 3-4 oz/week vodka liquor  . Drug use: Never    Current Facility-Administered Medications   Medication Dose Route Frequency Provider Last Rate Last Admin  . chlorhexidine (HIBICLENS) 4 % liquid 2 application  30 mL Topical UD Donata Clay, Theron Arista, MD      . dexmedetomidine (PRECEDEX) 400 MCG/100ML (4 mcg/mL) infusion  0.1-0.7 mcg/kg/hr Intravenous To OR Donata Clay, Theron Arista, MD      . EPINEPHrine (ADRENALIN) 4 mg in NS 250 mL (0.016 mg/mL) premix infusion  0-10 mcg/min Intravenous To OR Donata Clay, Theron Arista, MD      . heparin 2,500 Units, papaverine 30 mg in electrolyte-148 (PLASMALYTE-148) 500 mL irrigation   Irrigation To OR Donata Clay, Theron Arista, MD      . heparin 30,000 units/NS 1000 mL solution for CELLSAVER   Other To OR Donata Clay, Theron Arista, MD      . insulin regular, human (MYXREDLIN) 100 units/ 100 mL infusion   Intravenous To OR Donata Clay, Theron Arista, MD      . levofloxacin [facility]) IVPB 500 mg  500 mg Intravenous To OR Donata Clay, Theron Arista, MD      . magnesium sulfate (IV Push/IM) injection 40 mEq  40 mEq Other To OR Donata Clay, Theron Arista, MD      . metoprolol tartrate (LOPRESSOR) 12.5 mg tablet           . metoprolol tartrate (LOPRESSOR) tablet 12.5 mg  12.5 mg Oral Once Donata Clay, Theron Arista, MD      . milrinone (PRIMACOR) 20 MG/100 ML (0.2 mg/mL) infusion  0.3 mcg/kg/min Intravenous To OR Kerin Perna, MD      .  nitroGLYCERIN 50 mg in dextrose 5 % 250 mL (0.2 mg/mL) infusion  2-200 mcg/min Intravenous To OR Prescott Gum, Collier Salina, MD      . norepinephrine (LEVOPHED) 4mg  in 264mL premix infusion  0-40 mcg/min Intravenous To OR Prescott Gum, Collier Salina, MD      . phenylephrine (NEOSYNEPHRINE) 20-0.9 MG/250ML-% infusion  30-200 mcg/min Intravenous To OR Prescott Gum, Collier Salina, MD      . potassium chloride injection 80 mEq  80 mEq Other To OR Prescott Gum, Collier Salina, MD      . tranexamic acid (CYKLOKAPRON) 2,500 mg in sodium chloride 0.9 % 250 mL (10 mg/mL) infusion  1.5 mg/kg/hr Intravenous To OR Prescott Gum, Collier Salina, MD      . tranexamic acid (CYKLOKAPRON) bolus via infusion - over 30 minutes 1,192.5 mg  15 mg/kg Intravenous To OR Prescott Gum, Collier Salina, MD      . tranexamic acid (CYKLOKAPRON) pump prime solution 159 mg  2 mg/kg Intracatheter To OR Prescott Gum, Collier Salina, MD      . vancomycin [facility]) IVPB 1250 mg/250 mL  1,250 mg Intravenous To OR Ivin Poot, MD       Facility-Administered Medications Ordered in Other Encounters  Medication Dose Route Frequency Provider Last Rate Last Admin  . midazolam (VERSED) 5 MG/5ML injection   Intravenous Anesthesia Intra-op Edmonia James B, CRNA   1 mg at 08/24/19 0636    Allergies  Allergen Reactions  . Penicillins Rash    Did it involve swelling of the face/tongue/throat, SOB, or low BP? No Did it involve sudden or severe rash/hives, skin peeling, or any reaction on the inside of your mouth or nose?    #  #  YES  #  #  Did you need to seek medical attention at a hospital or doctor's office? No When did it last happen?childhood reaction.   . Latex Other (See Comments)    Contact dermatitis    Review of Systems   The patient is single and his brother who is also retired will be present to help him after surgery and after discharge. The patient is right-hand dominant The patient has had previous surgery to repair a cleft palate as an infant. The patient is allergic to penicillin but can take cephalosporins. No history of DVT varicose veins or venous insufficiency Previous colonoscopy was clean. No active dental complaints.  BP (!) 157/71   Pulse 78   Temp 98.3 F (36.8 C)   Resp 20   Ht 5\' 10"  (1.778 m)   Wt 79.5 kg   SpO2 99%   BMI 25.15 kg/m  Physical Exam     Physical Exam  General: Well-nourished healthy-appearing middle-aged male no acute distress HEENT: Normocephalic pupils equal , dentition adequate Neck: Supple without JVD, adenopathy, or bruit Chest: Clear to auscultation, symmetrical breath sounds, no rhonchi, no tenderness             or deformity Cardiovascular: Regular rate and rhythm, no murmur, no gallop, peripheral pulses              palpable in all extremities Abdomen:  Soft, nontender, no palpable mass or organomegaly Extremities: Warm, well-perfused, no clubbing cyanosis edema or tenderness,              no venous stasis changes of the legs Rectal/GU: Deferred Neuro: Grossly non--focal and symmetrical throughout Skin: Clean and dry without rash or ulceration   Diagnostic Tests: Coronary xerograms show severe proximal LAD and circumflex stenosis with normal LV  function  Impression: Unstable angina with history of type 1 diabetes and multivessel CAD  Plan: Patient would benefit from multivessel CABG using arterial grafts to the LAD and OM.  We discussed the details of the procedure as well as the preoperative preparation and postoperative hospital stay.  The patient's risk for surgery would be low with a less than 3% risk of stroke, bleeding, infection, organ failure.  Surgical be scheduled at [facility] on Tuesday, February 16.   Mikey Bussing, MD [facility] [phone]   All labs reviewed No change in exam Plan CABG today  patient examined and medical record reviewed,agree with above note. Kathlee Nations Trigt III 08/24/2019

## 2019-08-24 NOTE — Anesthesia Procedure Notes (Signed)
Arterial Line Insertion Performed by: Samara Deist, CRNA, CRNA  Patient location: Pre-op. Preanesthetic checklist: patient identified, IV checked, site marked, risks and benefits discussed, surgical consent, monitors and equipment checked, pre-op evaluation, timeout performed and anesthesia consent Lidocaine 1% used for infiltration and patient sedated Right, radial was placed Catheter size: 20 G Hand hygiene performed , maximum sterile barriers used  and Seldinger technique used Allen's test indicative of satisfactory collateral circulation Attempts: 1 Procedure performed without using ultrasound guided technique. Following insertion, Biopatch and dressing applied. Post procedure assessment: normal  Patient tolerated the procedure well with no immediate complications. Additional procedure comments: Genia Del, SRNA performed.

## 2019-08-24 NOTE — Anesthesia Procedure Notes (Signed)
Procedure Name: Intubation Performed by: Samara Deist, CRNA Pre-anesthesia Checklist: Patient identified, Emergency Drugs available, Suction available, Patient being monitored and Timeout performed Patient Re-evaluated:Patient Re-evaluated prior to induction Oxygen Delivery Method: Circle system utilized Preoxygenation: Pre-oxygenation with 100% oxygen Induction Type: IV induction Ventilation: Mask ventilation without difficulty Laryngoscope Size: Glidescope and 3 Grade View: Grade I Tube type: Oral Tube size: 8.0 mm Number of attempts: 1 Airway Equipment and Method: Video-laryngoscopy and Stylet Placement Confirmation: ETT inserted through vocal cords under direct vision,  positive ETCO2 and breath sounds checked- equal and bilateral Secured at: 23 cm Tube secured with: Tape Dental Injury: Teeth and Oropharynx as per pre-operative assessment  Difficulty Due To: Difficulty was anticipated and Difficult Airway- due to anterior larynx

## 2019-08-25 ENCOUNTER — Ambulatory Visit: Payer: BC Managed Care – PPO | Admitting: Cardiovascular Disease

## 2019-08-25 ENCOUNTER — Inpatient Hospital Stay (HOSPITAL_COMMUNITY): Payer: BC Managed Care – PPO

## 2019-08-25 DIAGNOSIS — I4891 Unspecified atrial fibrillation: Secondary | ICD-10-CM | POA: Diagnosis not present

## 2019-08-25 DIAGNOSIS — D62 Acute posthemorrhagic anemia: Secondary | ICD-10-CM | POA: Diagnosis not present

## 2019-08-25 DIAGNOSIS — J9811 Atelectasis: Secondary | ICD-10-CM | POA: Diagnosis not present

## 2019-08-25 DIAGNOSIS — E1042 Type 1 diabetes mellitus with diabetic polyneuropathy: Secondary | ICD-10-CM | POA: Diagnosis not present

## 2019-08-25 DIAGNOSIS — I25110 Atherosclerotic heart disease of native coronary artery with unstable angina pectoris: Secondary | ICD-10-CM | POA: Diagnosis not present

## 2019-08-25 LAB — GLUCOSE, CAPILLARY
Glucose-Capillary: 169 mg/dL — ABNORMAL HIGH (ref 70–99)
Glucose-Capillary: 177 mg/dL — ABNORMAL HIGH (ref 70–99)
Glucose-Capillary: 199 mg/dL — ABNORMAL HIGH (ref 70–99)
Glucose-Capillary: 200 mg/dL — ABNORMAL HIGH (ref 70–99)
Glucose-Capillary: 186 mg/dL — ABNORMAL HIGH (ref 70–99)
Glucose-Capillary: 210 mg/dL — ABNORMAL HIGH (ref 70–99)

## 2019-08-25 LAB — CBC
HCT: 30.4 % — ABNORMAL LOW (ref 39.0–52.0)
HCT: 32 % — ABNORMAL LOW (ref 39.0–52.0)
Hemoglobin: 10.4 g/dL — ABNORMAL LOW (ref 13.0–17.0)
Hemoglobin: 10.8 g/dL — ABNORMAL LOW (ref 13.0–17.0)
MCH: 31 pg (ref 26.0–34.0)
MCH: 31 pg (ref 26.0–34.0)
MCHC: 34.2 g/dL (ref 30.0–36.0)
MCHC: 33.8 g/dL (ref 30.0–36.0)
MCV: 90.5 fL (ref 80.0–100.0)
MCV: 92 fL (ref 80.0–100.0)
Platelets: 170 10*3/uL (ref 150–400)
Platelets: 179 10*3/uL (ref 150–400)
RBC: 3.36 MIL/uL — ABNORMAL LOW (ref 4.22–5.81)
RBC: 3.48 MIL/uL — ABNORMAL LOW (ref 4.22–5.81)
RDW: 12.4 % (ref 11.5–15.5)
RDW: 12.9 % (ref 11.5–15.5)
WBC: 13.6 10*3/uL — ABNORMAL HIGH (ref 4.0–10.5)
WBC: 19 10*3/uL — ABNORMAL HIGH (ref 4.0–10.5)
nRBC: 0 % (ref 0.0–0.2)
nRBC: 0 % (ref 0.0–0.2)

## 2019-08-25 LAB — BASIC METABOLIC PANEL
Anion gap: 9 (ref 5–15)
Anion gap: 12 (ref 5–15)
BUN: 21 mg/dL — ABNORMAL HIGH (ref 6–20)
BUN: 24 mg/dL — ABNORMAL HIGH (ref 6–20)
CO2: 20 mmol/L — ABNORMAL LOW (ref 22–32)
CO2: 20 mmol/L — ABNORMAL LOW (ref 22–32)
Calcium: 7.7 mg/dL — ABNORMAL LOW (ref 8.9–10.3)
Calcium: 7.8 mg/dL — ABNORMAL LOW (ref 8.9–10.3)
Chloride: 108 mmol/L (ref 98–111)
Chloride: 103 mmol/L (ref 98–111)
Creatinine, Ser: 1.09 mg/dL (ref 0.61–1.24)
Creatinine, Ser: 1.23 mg/dL (ref 0.61–1.24)
GFR calc Af Amer: >60 mL/min (ref >60)
GFR calc Af Amer: >60 mL/min (ref >60)
GFR calc non Af Amer: >60 mL/min (ref >60)
GFR calc non Af Amer: >60 mL/min (ref >60)
Glucose, Bld: 175 mg/dL — ABNORMAL HIGH (ref 70–99)
Glucose, Bld: 245 mg/dL — ABNORMAL HIGH (ref 70–99)
Potassium: 4.3 mmol/L (ref 3.5–5.1)
Potassium: 4.4 mmol/L (ref 3.5–5.1)
Sodium: 137 mmol/L (ref 135–145)
Sodium: 135 mmol/L (ref 135–145)

## 2019-08-25 LAB — MAGNESIUM
Magnesium: 2.3 mg/dL (ref 1.7–2.4)
Magnesium: 2 mg/dL (ref 1.7–2.4)

## 2019-08-25 MED ORDER — METOCLOPRAMIDE HCL 5 MG/ML IJ SOLN: 10.0000 mg | Freq: Four times a day (QID) | INTRAMUSCULAR | Status: DC | PRN

## 2019-08-25 MED ORDER — OXYCODONE HCL 5 MG PO TABS: 5.0000 mg | ORAL_TABLET | ORAL | Status: DC | PRN

## 2019-08-25 MED ORDER — NITROGLYCERIN IN D5W 200-5 MCG/ML-% IV SOLN
5.0000 ug/min | INTRAVENOUS | Status: DC
  Administered 2019-08-25: 21:00:00 5 ug/min via INTRAVENOUS
  Filled 2019-08-25: qty 250

## 2019-08-25 MED ORDER — FENTANYL CITRATE (PF) 100 MCG/2ML IJ SOLN
25.0000 ug | INTRAMUSCULAR | Status: DC | PRN
  Administered 2019-08-25 – 2019-08-26 (×3): 25 ug via INTRAVENOUS
  Filled 2019-08-25 (×4): qty 2

## 2019-08-25 MED ORDER — STUDY - ASTELLAS - ASP1128 OR PLACEBO 50 MG/10 ML IJ VIAL
400.0000 mL/h | INTRAVENOUS | Status: AC
  Administered 2019-08-25 – 2019-08-26 (×2): 400 mL/h via INTRAVENOUS
  Filled 2019-08-25: qty 30

## 2019-08-25 MED ORDER — PROMETHAZINE HCL 25 MG/ML IJ SOLN
12.5000 mg | Freq: Three times a day (TID) | INTRAMUSCULAR | Status: DC | PRN
  Administered 2019-08-25 (×2): 12.5 mg via INTRAVENOUS
  Filled 2019-08-25 (×2): qty 1

## 2019-08-25 MED ORDER — FUROSEMIDE 10 MG/ML IJ SOLN
20.0000 mg | Freq: Every day | INTRAMUSCULAR | Status: DC
  Administered 2019-08-25: 09:00:00 20 mg via INTRAVENOUS
  Filled 2019-08-25: qty 2

## 2019-08-25 MED ORDER — INSULIN PUMP
SUBCUTANEOUS | Status: DC
  Filled 2019-08-25: qty 1

## 2019-08-25 MED ORDER — SODIUM CHLORIDE 0.9 % IV SOLN
8.0000 mg | Freq: Four times a day (QID) | INTRAVENOUS | Status: DC | PRN
  Administered 2019-08-25: 11:00:00 8 mg via INTRAVENOUS
  Filled 2019-08-25: qty 4

## 2019-08-25 MED ORDER — PROMETHAZINE HCL 25 MG/ML IJ SOLN: 12.5000 mg | INTRAMUSCULAR | Status: DC | PRN

## 2019-08-25 MED ORDER — LABETALOL HCL 5 MG/ML IV SOLN
10.0000 mg | INTRAVENOUS | Status: DC | PRN
  Administered 2019-08-25: 15:00:00 10 mg via INTRAVENOUS
  Filled 2019-08-25: qty 4

## 2019-08-25 MED ORDER — FENTANYL CITRATE (PF) 100 MCG/2ML IJ SOLN: 50.0000 ug | INTRAMUSCULAR | Status: DC | PRN

## 2019-08-25 MED ORDER — INSULIN DETEMIR 100 UNIT/ML ~~LOC~~ SOLN
8.0000 [IU] | Freq: Two times a day (BID) | SUBCUTANEOUS | Status: DC
  Administered 2019-08-25 (×2): 8 [IU] via SUBCUTANEOUS
  Filled 2019-08-25 (×4): qty 0.08

## 2019-08-25 MED ORDER — MIDAZOLAM HCL 2 MG/2ML IJ SOLN: 1.0000 mg | INTRAMUSCULAR | Status: AC | PRN

## 2019-08-25 MED ORDER — LISINOPRIL 10 MG PO TABS
10.0000 mg | ORAL_TABLET | Freq: Every day | ORAL | Status: DC
  Administered 2019-08-26 – 2019-08-27 (×2): 10 mg via ORAL
  Filled 2019-08-25 (×2): qty 1

## 2019-08-25 MED ORDER — ISOSORBIDE MONONITRATE ER 30 MG PO TB24
15.0000 mg | ORAL_TABLET | Freq: Every day | ORAL | Status: DC
  Administered 2019-08-25: 15 mg via ORAL
  Filled 2019-08-25: qty 1

## 2019-08-25 NOTE — Progress Notes (Signed)
CT surgery p.m. Rounds  Patient's nausea much improved with Phenergan Maintaining sinus rhythm  p.m. labs satisfactory Good urine output Patient not able to control his insulin pump so will remain on sliding scale plus Levemir twice daily Blood pressure 120/64, pulse 88, temperature 98.4 F (36.9 C), temperature source Oral, resp. rate 15, height 5\' 10"  (1.778 m), weight 82.9 kg, SpO2 98 %.

## 2019-08-25 NOTE — Progress Notes (Signed)
      [address]       [place] [address]             [phone]       Checked on the patient due to vomiting earlier. Spoke with nursing. Appears that the phenergan is helping the patient's nausea. Now it appears his BP is elevated. Systolic in the 160s. Since he is unable to tolerate some pills at this time, IV labetalol 10mg  was added q2 hours PRN. Also, Imdur added today and weaning off nitro. Lisinopril 10mg  also added however, patient may not be able to take.   , PA-C

## 2019-08-25 NOTE — Research (Signed)
[facility] Research Study    Nephro check #1 drawn @ 1645 resulted @1728 - 0.09 Nephro check #2 drawn@ 1850 result @ 1936- 0.39  Patient will be in Randomized Arm of the [employer] study.

## 2019-08-25 NOTE — Research (Addendum)
[facility] Research Study  Visit 3 Randomized Arm   Visit 3 labs and urine collected @ 1200  Infusion 2 given @ 1206  EOI labs collected @ 1230       ABNORMAL LABS   [x]    No change or change not clinically significant. NO FOLLOW UP REQUIRED. []    Change clinically significant and attributable to disease or management. NO FOLLOW UP  REQUIRED. []    Change clinically significant and possibly attributable to study medication. NO FOLLOW UP REQUIRED. []    Change clinically significant and attributable to study medication. FOLLOW UP REQUIRED. []    Apparent lab error. []    Unevaluable.

## 2019-08-25 NOTE — Progress Notes (Addendum)
Pt having c/o with N/V overnight despite scheduled reglan and prn zofran. While dangling this AM pt became very nauseous-

## 2019-08-25 NOTE — Anesthesia Postprocedure Evaluation (Signed)
Anesthesia Post Note  Patient: Harry Moore  Procedure(s) Performed: CORONARY ARTERY BYPASS GRAFTING (CABG) times three, using left radial atery harvested endoscopically and right greater saphenous vein harvested endoscopically and left internal mammary artery. (N/A Chest) RADIAL ARTERY HARVEST (Left Arm Lower) TRANSESOPHAGEAL ECHOCARDIOGRAM (TEE) (N/A )     Patient location during evaluation: SICU Anesthesia Type: General Level of consciousness: awake and alert Pain management: pain level controlled Vital Signs Assessment: post-procedure vital signs reviewed and stable Respiratory status: spontaneous breathing, nonlabored ventilation and respiratory function stable Cardiovascular status: blood pressure returned to baseline and stable Anesthetic complications: no    Last Vitals:  Vitals:   08/25/19 0817 08/25/19 0900  BP:  138/68  Pulse: 88 91  Resp: 15 (!) 21  Temp: (!) 36.4 C 36.5 C  SpO2: 98% 99%    Last Pain:  Vitals:   08/25/19 0400  TempSrc:   PainSc: 3                  Alfio Loescher,W. EDMOND

## 2019-08-25 NOTE — Progress Notes (Signed)
Inpatient Diabetes Program Recommendations  AACE/ADA: New Consensus Statement on Inpatient Glycemic Control (2015)  Target Ranges:  Prepandial:   less than 140 mg/dL      Peak postprandial:   less than 180 mg/dL (1-2 hours)      Critically ill patients:  140 - 180 mg/dL   Lab Results  Component Value Date   GLUCAP 199 (H) 08/25/2019   HGBA1C 7.3 (H) 08/20/2019    Review of Glycemic Control Results for Harry Moore, Harry Moore (MRN [id number]) as of 08/25/2019 13:41  Ref. Range 08/24/2019 23:30 08/25/2019 03:16 08/25/2019 07:56 08/25/2019 11:40  Glucose-Capillary Latest Ref Range: 70 - 99 mg/dL 037 (H) 096 (H) 438 (H) 199 (H)   Diabetes history: Type 1 DM Outpatient Diabetes medications: Medronic 670G  Current orders for Inpatient glycemic control: insulin pump Q4H, Novolog 0-24 units Q4H  Inpatient Diabetes Program Recommendations:    Patient unable to operate insulin pump safely given nausea.   Patient is type 1 DM and requires basal insulin. At this time consider adding Levemir 8 units BID (to start now) and discontinue insulin pump order set.   Spoke with patient to verify insulin pump settings. Patient reports on average 1 unit/hr for basal but has 670G and thus auto regulate. Actively vomiting and unable to state other settings. Secure chat sent to Md with recommendations.    Thanks, Lujean Rave, MSN, RNC-OB Diabetes Coordinator [phone] (8a-5p)

## 2019-08-25 NOTE — Progress Notes (Addendum)
[facility] DAILY ICU PROGRESS NOTE                   [address]            [place] [address]          [phone]   1 Day Post-Op Procedure(s) (LRB): CORONARY ARTERY BYPASS GRAFTING (CABG) times three, using left radial atery harvested endoscopically and right greater saphenous vein harvested endoscopically and left internal mammary artery. (N/A) RADIAL ARTERY HARVEST (Left) TRANSESOPHAGEAL ECHOCARDIOGRAM (TEE) (N/A)  Total Length of Stay:  LOS: 1 day   Subjective: Feels "weird" this morning. He believes he got too much pain medication. Also having some nausea.   Objective: Vital signs in last 24 hours: Temp:  [96.3 F (35.7 C)-98.6 F (37 C)] 97.5 F (36.4 C) (02/17 0817) Pulse Rate:  [68-90] 88 (02/17 0817) Cardiac Rhythm: Atrial paced (02/17 0400) Resp:  [8-24] 15 (02/17 0817) BP: (91-137)/(53-84) 119/66 (02/17 0800) SpO2:  [97 %-100 %] 98 % (02/17 0817) Arterial Line BP: (99-164)/(43-83) 133/51 (02/17 0817) FiO2 (%):  [36 %-50 %] 36 % (02/16 1803) Weight:  [82.9 kg] 82.9 kg (02/17 0600)  Filed Weights   08/24/19 0554 08/25/19 0600  Weight: 79.5 kg 82.9 kg    Weight change: 3.4 kg   Hemodynamic parameters for last 24 hours: PAP: (19-34)/(6-18) 34/13 CO:  [4.3 L/min-6.8 L/min] 6.8 L/min CI:  [2.2 L/min/m2-3.5 L/min/m2] 3.5 L/min/m2  Intake/Output from previous day: 02/16 0701 - 02/17 0700 In: 5212.4 [I.V.:3579.3; Blood:150; IV Piggyback:1483.1] Out: 2635 [Urine:1715; Blood:300; Chest Tube:620]  Intake/Output this shift: Total I/O In: -  Out: 48 [Urine:38; Chest Tube:10]  Current Meds: Scheduled Meds: . acetaminophen  1,000 mg Oral Q6H   Or  . acetaminophen (TYLENOL) oral liquid 160 mg/5 mL  1,000 mg Per Tube Q6H  . aspirin EC  325 mg Oral Daily   Or  . aspirin  324 mg Per Tube Daily  . bisacodyl  10 mg Oral Daily   Or  . bisacodyl  10 mg Rectal Daily  . Chlorhexidine Gluconate Cloth  6 each Topical Daily  . docusate sodium  200 mg  Oral Daily  . furosemide  20 mg Intravenous Daily  . insulin aspart  0-24 Units Subcutaneous Q4H  . insulin pump   Subcutaneous Q4H  . isosorbide mononitrate  15 mg Oral Daily  . isosorbide mononitrate  15 mg Oral Daily  . latanoprost  1 drop Both Eyes QHS  . mouth rinse  15 mL Mouth Rinse BID  . metoCLOPramide (REGLAN) injection  10 mg Intravenous Q6H  . metoprolol tartrate  12.5 mg Oral BID   Or  . metoprolol tartrate  12.5 mg Per Tube BID  . [START ON 08/26/2019] pantoprazole  40 mg Oral Daily  . rosuvastatin  20 mg Oral QHS  . sodium chloride flush  3 mL Intravenous Q12H  . STUDY - ASTELLAS - [id number] or placebo 100 mg in dextrose 5% 100 mL (1 mg/mL) IV infusion (PI-Owen)  400 mL/hr Intravenous Q24H  . timolol  1 drop Both Eyes Daily   Continuous Infusions: . sodium chloride Stopped (08/24/19 2113)  . sodium chloride    . sodium chloride 10 mL/hr at 08/24/19 1507  . albumin human Stopped (08/25/19 0620)  . famotidine (PEPCID) IV Stopped (08/24/19 1510)  . lactated ringers    . lactated ringers    . lactated ringers 20 mL/hr at 08/25/19 0700  . norepinephrine (LEVOPHED) Adult infusion  Stopped (08/24/19 1432)  . phenylephrine (NEO-SYNEPHRINE) Adult infusion 20 mcg/min (08/25/19 0700)   PRN Meds:.sodium chloride, albumin human, dextrose, fentaNYL (SUBLIMAZE) injection, lactated ringers, metoprolol tartrate, midazolam, ondansetron (ZOFRAN) IV, oxyCODONE, sodium chloride flush, traMADol  General appearance: alert, cooperative and no distress Heart: regular rate and rhythm, S1, S2 normal, no murmur, click, rub or gallop Lungs: clear to auscultation bilaterally, diminished in the lower lobes Abdomen: soft, non-tender; bowel sounds normal; no masses,  no organomegaly Extremities: extremities normal, upper ext. edema Wound: clean and dry, covered with a sterile dressing  Lab Results: CBC: Recent Labs    08/24/19 2030 08/25/19 0340  WBC 12.3* 13.6*  HGB 11.0* 10.4*  HCT 31.2*  30.4*  PLT 165 170   BMET:  Recent Labs    08/24/19 2030 08/25/19 0340  NA 138 137  K 4.3 4.3  CL 107 108  CO2 21* 20*  GLUCOSE 123* 175*  BUN 18 21*  CREATININE 0.99 1.09  CALCIUM 7.7* 7.7*    CMET: Lab Results  Component Value Date   WBC 13.6 (H) 08/25/2019   HGB 10.4 (L) 08/25/2019   HCT 30.4 (L) 08/25/2019   PLT 170 08/25/2019   GLUCOSE 175 (H) 08/25/2019   CHOL 139 08/05/2019   TRIG 76 08/05/2019   HDL 41 08/05/2019   LDLCALC 83 08/05/2019   ALT 23 08/20/2019   AST 21 08/20/2019   NA 137 08/25/2019   K 4.3 08/25/2019   CL 108 08/25/2019   CREATININE 1.09 08/25/2019   BUN 21 (H) 08/25/2019   CO2 20 (L) 08/25/2019   INR 1.5 (H) 08/24/2019   HGBA1C 7.3 (H) 08/20/2019      PT/INR:  Recent Labs    08/24/19 1443  LABPROT 17.6*  INR 1.5*   Radiology: [facility] Chest Port 1 View  Result Date: 08/24/2019 CLINICAL DATA:  Status post CABG today. EXAM: PORTABLE CHEST 1 VIEW COMPARISON:  PA and lateral chest 08/20/2019. FINDINGS: Endotracheal tube is in place with the tip in good position at the level of the clavicular heads. Right IJ approach Swan-Ganz catheter tip is in the distal pulmonary outflow tract. NG tube side port is in the distal esophagus. The tube should be advanced 5-6 cm for better positioning. Left chest tube and mediastinal drain are noted. Lungs are clear. Heart size is normal. No pneumothorax or pleural effusion. IMPRESSION: NG tube side port is in the distal esophagus. The tube should be advanced 5-6 cm for better positioning. Swan-Ganz catheter tip is in the distal pulmonary outflow tract. Support apparatus is otherwise unremarkable. Clear lungs.  No pneumothorax. Electronically Signed   By: Drusilla Kanner M.D.   On: 08/24/2019 15:06     Assessment/Plan: S/P Procedure(s) (LRB): CORONARY ARTERY BYPASS GRAFTING (CABG) times three, using left radial atery harvested endoscopically and right greater saphenous vein harvested endoscopically and left  internal mammary artery. (N/A) RADIAL ARTERY HARVEST (Left) TRANSESOPHAGEAL ECHOCARDIOGRAM (TEE) (N/A)  1. CV-NSR in the 80s-90s. Currently paced at 88bpm. Underlying NSR 92.  BP well controlled. Continue asa, statin, and lopressor. Remains on Nitro gtt. Wean as able 2. Pulm-Remains on 4L [place]. Continue to encourage incentive spirometer. CXR stable. 620cc/24 hours-continue chest tubes.  3. Renal-creatinine 1.09, electrolytes okay. Good urine output.  4. H and H 10.4/30.4, expected acute blood loss anemia 5. Endo-blood glucose well controlled. Changing insulin drip over to SSI.    Plan: See progression orders. Weaning drips as able today. Imdur for radial artery harvest. Okay to remove swan-ganz catheter.  Arterial line out once off nitro. OOB to chair. Decreased pain medication dose since patient appears sensitive. Zofran for nausea.    Sharlene Dory 08/25/2019 8:21 AM   Type I diabetic status post CABG x3 with LIMA and left radial artery Sinus rhythm Imdur started DC lines and start diuresis and mobilize Transition from insulin drip to the patient's own insulin pump  patient examined and medical record reviewed,agree with above note. Kathlee Nations Trigt III 08/25/2019

## 2019-08-25 NOTE — Plan of Care (Signed)

## 2019-08-26 ENCOUNTER — Telehealth: Payer: Self-pay | Admitting: General Practice

## 2019-08-26 ENCOUNTER — Inpatient Hospital Stay (HOSPITAL_COMMUNITY): Payer: BC Managed Care – PPO

## 2019-08-26 DIAGNOSIS — Z4682 Encounter for fitting and adjustment of non-vascular catheter: Secondary | ICD-10-CM | POA: Diagnosis not present

## 2019-08-26 LAB — POCT I-STAT, CHEM 8
BUN: 19 mg/dL (ref 6–20)
BUN: 18 mg/dL (ref 6–20)
BUN: 14 mg/dL (ref 6–20)
BUN: 17 mg/dL (ref 6–20)
BUN: 18 mg/dL (ref 6–20)
BUN: 16 mg/dL (ref 6–20)
Calcium, Ion: 1.22 mmol/L (ref 1.15–1.40)
Calcium, Ion: 1.19 mmol/L (ref 1.15–1.40)
Calcium, Ion: 0.84 mmol/L — CL (ref 1.15–1.40)
Calcium, Ion: 1.06 mmol/L — ABNORMAL LOW (ref 1.15–1.40)
Calcium, Ion: 1.09 mmol/L — ABNORMAL LOW (ref 1.15–1.40)
Calcium, Ion: 1.05 mmol/L — ABNORMAL LOW (ref 1.15–1.40)
Chloride: 103 mmol/L (ref 98–111)
Chloride: 102 mmol/L (ref 98–111)
Chloride: 98 mmol/L (ref 98–111)
Chloride: 101 mmol/L (ref 98–111)
Chloride: 104 mmol/L (ref 98–111)
Chloride: 104 mmol/L (ref 98–111)
Creatinine, Ser: 0.8 mg/dL (ref 0.61–1.24)
Creatinine, Ser: 0.8 mg/dL (ref 0.61–1.24)
Creatinine, Ser: 0.5 mg/dL — ABNORMAL LOW (ref 0.61–1.24)
Creatinine, Ser: 0.7 mg/dL (ref 0.61–1.24)
Creatinine, Ser: 0.8 mg/dL (ref 0.61–1.24)
Creatinine, Ser: 0.6 mg/dL — ABNORMAL LOW (ref 0.61–1.24)
Glucose, Bld: 124 mg/dL — ABNORMAL HIGH (ref 70–99)
Glucose, Bld: 132 mg/dL — ABNORMAL HIGH (ref 70–99)
Glucose, Bld: 101 mg/dL — ABNORMAL HIGH (ref 70–99)
Glucose, Bld: 121 mg/dL — ABNORMAL HIGH (ref 70–99)
Glucose, Bld: 129 mg/dL — ABNORMAL HIGH (ref 70–99)
Glucose, Bld: 121 mg/dL — ABNORMAL HIGH (ref 70–99)
HCT: 31 % — ABNORMAL LOW (ref 39.0–52.0)
HCT: 26 % — ABNORMAL LOW (ref 39.0–52.0)
HCT: 20 % — ABNORMAL LOW (ref 39.0–52.0)
HCT: 26 % — ABNORMAL LOW (ref 39.0–52.0)
HCT: 26 % — ABNORMAL LOW (ref 39.0–52.0)
HCT: 26 % — ABNORMAL LOW (ref 39.0–52.0)
Hemoglobin: 10.5 g/dL — ABNORMAL LOW (ref 13.0–17.0)
Hemoglobin: 8.8 g/dL — ABNORMAL LOW (ref 13.0–17.0)
Hemoglobin: 6.8 g/dL — CL (ref 13.0–17.0)
Hemoglobin: 8.8 g/dL — ABNORMAL LOW (ref 13.0–17.0)
Hemoglobin: 8.8 g/dL — ABNORMAL LOW (ref 13.0–17.0)
Hemoglobin: 8.8 g/dL — ABNORMAL LOW (ref 13.0–17.0)
Potassium: 4.1 mmol/L (ref 3.5–5.1)
Potassium: 4 mmol/L (ref 3.5–5.1)
Potassium: 4.1 mmol/L (ref 3.5–5.1)
Potassium: 4.2 mmol/L (ref 3.5–5.1)
Potassium: 4.8 mmol/L (ref 3.5–5.1)
Potassium: 4.2 mmol/L (ref 3.5–5.1)
Sodium: 140 mmol/L (ref 135–145)
Sodium: 139 mmol/L (ref 135–145)
Sodium: 139 mmol/L (ref 135–145)
Sodium: 139 mmol/L (ref 135–145)
Sodium: 139 mmol/L (ref 135–145)
Sodium: 139 mmol/L (ref 135–145)
TCO2: 29 mmol/L (ref 22–32)
TCO2: 28 mmol/L (ref 22–32)
TCO2: 29 mmol/L (ref 22–32)
TCO2: 29 mmol/L (ref 22–32)
TCO2: 26 mmol/L (ref 22–32)
TCO2: 29 mmol/L (ref 22–32)

## 2019-08-26 LAB — POCT I-STAT 7, (LYTES, BLD GAS, ICA,H+H)
Acid-Base Excess: 2 mmol/L (ref 0.0–2.0)
Acid-Base Excess: 4 mmol/L — ABNORMAL HIGH (ref 0.0–2.0)
Acid-Base Excess: 3 mmol/L — ABNORMAL HIGH (ref 0.0–2.0)
Bicarbonate: 27.8 mmol/L (ref 20.0–28.0)
Bicarbonate: 27.4 mmol/L (ref 20.0–28.0)
Bicarbonate: 27.5 mmol/L (ref 20.0–28.0)
Calcium, Ion: 1.21 mmol/L (ref 1.15–1.40)
Calcium, Ion: 0.85 mmol/L — CL (ref 1.15–1.40)
Calcium, Ion: 1.04 mmol/L — ABNORMAL LOW (ref 1.15–1.40)
HCT: 32 % — ABNORMAL LOW (ref 39.0–52.0)
HCT: 20 % — ABNORMAL LOW (ref 39.0–52.0)
HCT: 27 % — ABNORMAL LOW (ref 39.0–52.0)
Hemoglobin: 10.9 g/dL — ABNORMAL LOW (ref 13.0–17.0)
Hemoglobin: 6.8 g/dL — CL (ref 13.0–17.0)
Hemoglobin: 9.2 g/dL — ABNORMAL LOW (ref 13.0–17.0)
O2 Saturation: 100 %
O2 Saturation: 100 %
O2 Saturation: 100 %
Potassium: 4 mmol/L (ref 3.5–5.1)
Potassium: 4.2 mmol/L (ref 3.5–5.1)
Potassium: 4.2 mmol/L (ref 3.5–5.1)
Sodium: 140 mmol/L (ref 135–145)
Sodium: 141 mmol/L (ref 135–145)
Sodium: 138 mmol/L (ref 135–145)
TCO2: 29 mmol/L (ref 22–32)
TCO2: 28 mmol/L (ref 22–32)
TCO2: 29 mmol/L (ref 22–32)
pCO2 arterial: 45.6 mmHg (ref 32.0–48.0)
pCO2 arterial: 32.8 mmHg (ref 32.0–48.0)
pCO2 arterial: 41.8 mmHg (ref 32.0–48.0)
pH, Arterial: 7.393 (ref 7.350–7.450)
pH, Arterial: 7.53 — ABNORMAL HIGH (ref 7.350–7.450)
pH, Arterial: 7.426 (ref 7.350–7.450)
pO2, Arterial: 529 mmHg — ABNORMAL HIGH (ref 83.0–108.0)
pO2, Arterial: 359 mmHg — ABNORMAL HIGH (ref 83.0–108.0)
pO2, Arterial: 435 mmHg — ABNORMAL HIGH (ref 83.0–108.0)

## 2019-08-26 LAB — CBC
HCT: 31.9 % — ABNORMAL LOW (ref 39.0–52.0)
Hemoglobin: 10.8 g/dL — ABNORMAL LOW (ref 13.0–17.0)
MCH: 31.5 pg (ref 26.0–34.0)
MCHC: 33.9 g/dL (ref 30.0–36.0)
MCV: 93 fL (ref 80.0–100.0)
Platelets: 172 10*3/uL (ref 150–400)
RBC: 3.43 MIL/uL — ABNORMAL LOW (ref 4.22–5.81)
RDW: 13 % (ref 11.5–15.5)
WBC: 16 10*3/uL — ABNORMAL HIGH (ref 4.0–10.5)
nRBC: 0 % (ref 0.0–0.2)

## 2019-08-26 LAB — BASIC METABOLIC PANEL
Anion gap: 10 (ref 5–15)
BUN: 21 mg/dL — ABNORMAL HIGH (ref 6–20)
CO2: 22 mmol/L (ref 22–32)
Calcium: 7.8 mg/dL — ABNORMAL LOW (ref 8.9–10.3)
Chloride: 103 mmol/L (ref 98–111)
Creatinine, Ser: 1.36 mg/dL — ABNORMAL HIGH (ref 0.61–1.24)
GFR calc Af Amer: >60 mL/min (ref >60)
GFR calc non Af Amer: 57 mL/min — ABNORMAL LOW (ref >60)
Glucose, Bld: 232 mg/dL — ABNORMAL HIGH (ref 70–99)
Potassium: 4.2 mmol/L (ref 3.5–5.1)
Sodium: 135 mmol/L (ref 135–145)

## 2019-08-26 LAB — GLUCOSE, CAPILLARY
Glucose-Capillary: 204 mg/dL — ABNORMAL HIGH (ref 70–99)
Glucose-Capillary: 172 mg/dL — ABNORMAL HIGH (ref 70–99)
Glucose-Capillary: 277 mg/dL — ABNORMAL HIGH (ref 70–99)
Glucose-Capillary: 212 mg/dL — ABNORMAL HIGH (ref 70–99)
Glucose-Capillary: 175 mg/dL — ABNORMAL HIGH (ref 70–99)
Glucose-Capillary: 166 mg/dL — ABNORMAL HIGH (ref 70–99)
Glucose-Capillary: 175 mg/dL — ABNORMAL HIGH (ref 70–99)

## 2019-08-26 MED ORDER — ENOXAPARIN SODIUM 40 MG/0.4ML ~~LOC~~ SOLN
40.0000 mg | SUBCUTANEOUS | Status: DC
  Administered 2019-08-26 – 2019-08-29 (×4): 40 mg via SUBCUTANEOUS
  Filled 2019-08-26 (×4): qty 0.4

## 2019-08-26 MED ORDER — FUROSEMIDE 10 MG/ML IJ SOLN
20.0000 mg | Freq: Once | INTRAMUSCULAR | Status: AC
  Administered 2019-08-26: 10:00:00 20 mg via INTRAVENOUS
  Filled 2019-08-26: qty 2

## 2019-08-26 MED ORDER — INSULIN PUMP
SUBCUTANEOUS | Status: DC
  Administered 2019-08-28: 14:00:00 4 via SUBCUTANEOUS
  Administered 2019-08-28: 08:00:00 3 via SUBCUTANEOUS
  Administered 2019-08-29: 08:00:00 1 via SUBCUTANEOUS
  Filled 2019-08-26: qty 1

## 2019-08-26 MED ORDER — ISOSORBIDE MONONITRATE ER 30 MG PO TB24
30.0000 mg | ORAL_TABLET | Freq: Every day | ORAL | Status: DC
  Administered 2019-08-26 – 2019-08-30 (×5): 30 mg via ORAL
  Filled 2019-08-26 (×5): qty 1

## 2019-08-26 MED ORDER — ASPIRIN EC 81 MG PO TBEC
81.0000 mg | DELAYED_RELEASE_TABLET | Freq: Every day | ORAL | Status: DC
  Administered 2019-08-27 – 2019-08-30 (×4): 81 mg via ORAL
  Filled 2019-08-26 (×4): qty 1

## 2019-08-26 MED ORDER — FUROSEMIDE 40 MG PO TABS
40.0000 mg | ORAL_TABLET | Freq: Every day | ORAL | Status: DC
  Administered 2019-08-27: 10:00:00 40 mg via ORAL
  Filled 2019-08-26: qty 1

## 2019-08-26 MED ORDER — CLOPIDOGREL BISULFATE 75 MG PO TABS
75.0000 mg | ORAL_TABLET | Freq: Every day | ORAL | Status: DC
  Administered 2019-08-27 – 2019-08-30 (×4): 75 mg via ORAL
  Filled 2019-08-26 (×4): qty 1

## 2019-08-26 MED ORDER — METOPROLOL TARTRATE 25 MG PO TABS
25.0000 mg | ORAL_TABLET | Freq: Two times a day (BID) | ORAL | Status: DC
  Administered 2019-08-26 – 2019-08-27 (×4): 25 mg via ORAL
  Filled 2019-08-26 (×4): qty 1

## 2019-08-26 MED ORDER — INSULIN DETEMIR 100 UNIT/ML ~~LOC~~ SOLN
10.0000 [IU] | Freq: Two times a day (BID) | SUBCUTANEOUS | Status: DC
  Filled 2019-08-26 (×2): qty 0.1

## 2019-08-26 NOTE — Telephone Encounter (Signed)
New Message   Patient has a TOC appt on 2/24 with Edd Fabian

## 2019-08-26 NOTE — Telephone Encounter (Signed)
Called left message for patient to return call for [facility] May speak to any  Triage nurse

## 2019-08-26 NOTE — Discharge Summary (Addendum)
Physician Discharge Summary  Patient ID: Harry Moore MRN: [id number] DOB/AGE: [birth year] 59 y.o.  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  Recent cardiac catheterization by Dr. [name] demonstrates high-grade stenosis of the proximal LAD and OM1.  The RCA has no significant disease.  There is a small diagonal between lesions in the proximal LAD.  LVEDP is normal.  EF by ventriculogram is normal.   The patient has taken good care of his diabetes over the years and is now on a insulin pump with a sensor.  He has some neuropathy in his lower extremities but no evidence of peripheral vascular disease, diabetic ulcers, retinopathy or renal disease.  Lipid panel checked by Dr. [name] shows LDL 84 on Crestor 20 mg.   No history of thoracic trauma, chronic pulmonary disease, smoking, or carotid disease. His father had CABG at age 44 as well.  The patient was admitted electively for the procedure.(CABG) Discharged Condition: good  Hospital Course: The patient was admitted electively and taken to the operating room on 08/24/2019 where he underwent the below described procedure.  He tolerated it well and was taken to the surgical intensive care unit in stable condition.  Postoperative hospital course:  The patient has done well overall.  He did have some significant nausea in the early postoperative period  which has resolved with standard measures.  He has remained hemodynamically stable and in sinus rhythm.  He does have a mild expected acute blood loss anemia which is stable.  He has required some gentle diuresis but is responded well to diuretics.  All routine lines, monitors and drainage devices were discontinued in standard fashion.  Oxygen has been weaned and he maintains good saturations on room air.  Incisions are noted to be healing well without evidence of infection.  He is tolerating gradually increasing activities using standard cardiac rehab modalities.  His insulin pump has been reinstated and his blood sugar control is improving over time.  He has had some hypertension and has been placed on lisinopril and metoprolol has been uptitrated over time.  He additionally is on Imdur for radial artery harvest for one month post-operatively.  He is also placed on baby aspirin and Plavix.At the time of discharge the patient was felt to be quite stable.  Consults: None  Significant Diagnostic Studies: routine post op serial labs and CXR's  Treatments: surgery:  OPERATIVE REPORT   DATE OF PROCEDURE:  08/24/2019   OPERATIONS: 1.  Coronary artery bypass grafting x3 (left internal mammary artery to left anterior descending, left radial artery free graft to obtuse marginal 1, saphenous vein graft to diagonal). 2.  Endoscopic harvest of right leg greater saphenous vein. 3.  Endoscopic harvest of left arm radial artery.   SURGEON:  Kerin Perna, MD   ASSISTANT:  Gershon Crane, PA-C.   ANESTHESIA:  General by Dr. Marguerita Merles.   PREOPERATIVE DIAGNOSES:  1.  Severe multivessel coronary artery disease with accelerating angina, normal left ventricular function.   2.  Type 1 diabetes on insulin pump.   POSTOPERATIVE DIAGNOSES:   1.  Severe multivessel coronary artery disease with accelerating angina, normal left ventricular function.   2.  Type 1 diabetes on insulin pump.  Discharge  Exam: Blood pressure (!) 161/86, pulse (!) 107, temperature 98.1 F (36.7 C), temperature source Oral, resp. rate 20, height 5\' 10"  (1.778 m), weight 77.7 kg, SpO2 97 %.  General appearance: alert, cooperative and no distress Heart: regular rate and rhythm Lungs: clear to auscultation bilaterally Abdomen: benign Extremities: no edema, left hand N/V intact Wound: incis healing well Disposition:  Discharge disposition: 01-Home or Self Care       Discharge Instructions     Amb Referral to Cardiac Rehabilitation   Complete by: As directed    Referring to [facility] CRP 2   Diagnosis: CABG   CABG X ___: 3   After initial evaluation and assessments completed: Virtual Based Care may be provided alone or in conjunction with Phase 2 Cardiac Rehab based on patient barriers.: Yes   Discharge patient   Complete by: As directed    Discharge disposition: 01-Home or Self Care   Discharge patient date: 08/30/2019      Allergies as of 08/30/2019       Reactions   Penicillins Rash   Did it involve swelling of the face/tongue/throat, SOB, or low BP? No Did it involve sudden or severe rash/hives, skin peeling, or any reaction on the inside of your mouth or nose?    #  #  YES  #  #  Did you need to seek medical attention at a hospital or doctor's office? No When did it last happen?childhood reaction.   Latex Other (See Comments)   Contact dermatitis        Medication List     TAKE these medications    aspirin EC 81 MG tablet Take 81 mg by mouth at bedtime.   clopidogrel 75 MG tablet Commonly known as: PLAVIX Take 1 tablet (75 mg total) by mouth daily.   docusate sodium 100 MG capsule Commonly known as: COLACE Take 100 mg by mouth at bedtime.   hydrochlorothiazide 25 MG tablet Commonly known as: HYDRODIURIL Take 12.5 mg by mouth at bedtime.   insulin pump Soln Inject into the skin. insulin aspart (NOVOLOG) 100 UNIT/ML injection   isosorbide mononitrate 30 MG 24 hr  tablet Commonly known as: IMDUR Take 1 tablet (30 mg total) by mouth daily.   latanoprost 0.005 % ophthalmic solution Commonly known as: XALATAN Place 1 drop into both eyes at bedtime.   lisinopril 20 MG tablet Commonly known as: ZESTRIL Take 20 mg by mouth at bedtime.   metoprolol tartrate 50 MG tablet Commonly known as: LOPRESSOR Take 1 tablet (50 mg total) by mouth 2 (two) times daily. What changed:  medication strength how much to take   NovoLOG 100 UNIT/ML injection Generic drug: insulin aspart Inject into the skin. Via Insulin PUMP   rosuvastatin 20 MG tablet Commonly known as: CRESTOR Take 20 mg by mouth at bedtime.   timolol 0.5 % ophthalmic solution Commonly known as: TIMOPTIC Place 1 drop into both eyes daily.   traMADol 50 MG tablet Commonly known as: ULTRAM Take 1 tablet (50 mg total) by mouth every 6 (six) hours as needed for up to 7 days for moderate pain.       Follow-up Information  Ivin Poot, MD Follow up.   Specialty: Cardiothoracic Surgery Why: Please see discharge paperwork for follow-up appointment with surgeon.  Also obtain a chest x-ray at [facility] 1/2-hour prior to this appointment.  It is located in the same office complex. Contact information: [address] [place] [address] [phone]         Deberah Pelton, NP Follow up on 09/01/2019.   Specialty: Cardiology Why: Please arrive 15 minutes early for your 11:15am  post-hospital cardiology appointment Contact information: [address] [place] [address] [phone]           The patient has been discharged on:   1.Beta Blocker:  Yes Blue.[email or web address]   ]                              No   [   ]                              If No, reason:  2.Ace Inhibitor/ARB: Yes Blue.Reese   ]                                     No  [    ]                                     If No, reason:  3.Statin:   Yes [  y ]                  No  [   ]                   If No, reason:  4.Ecasa:  Yes  [ y  ]                  No   [   ]                  If No, reason:  Signed: Odis Luster 08/30/2019, 10:35 AM  Discharge instructions reviewed with patient including medications, diet, activity limits, wound care, and follow-up appointment.  patient examined and medical record reviewed,agree with above note. Tharon Aquas Trigt III 08/30/2019

## 2019-08-26 NOTE — Discharge Instructions (Signed)
Endoscopic Saphenous Vein Harvesting, Care After This sheet gives you information about how to care for yourself after your procedure. Your health care provider may also give you more specific instructions. If you have problems or questions, contact your health care provider. What can I expect after the procedure? After the procedure, it is common to have:  Pain.  Bruising.  Swelling.  Numbness. Follow these instructions at home: Incision care   Follow instructions from your health care provider about how to take care of your incisions. Make sure you: ? Wash your hands with soap and water before and after you change your bandages (dressings). If soap and water are not available, use hand sanitizer. ? Change your dressings as told by your health care provider. ? Leave stitches (sutures), skin glue, or adhesive strips in place. These skin closures may need to stay in place for 2 weeks or longer. If adhesive strip edges start to loosen and curl up, you may trim the loose edges. Do not remove adhesive strips completely unless your health care provider tells you to do that.  Check your incision areas every day for signs of infection. Check for: ? More redness, swelling, or pain. ? Fluid or blood. ? Warmth. ? Pus or a bad smell. Medicines  Take over-the-counter and prescription medicines only as told by your health care provider.  Ask your health care provider if the medicine prescribed to you requires you to avoid driving or using heavy machinery. General instructions  Raise (elevate) your legs above the level of your heart while you are sitting or lying down.  Avoid crossing your legs.  Avoid sitting for long periods of time. Change positions every 30 minutes.  Do any exercises your health care providers have given you. These may include deep breathing, coughing, and walking exercises.  Do not take baths, swim, or use a hot tub until your health care provider approves. Ask your  health care provider if you may take showers. You may only be allowed to take sponge baths.  Wear compression stockings as told by your health care provider. These stockings help to prevent blood clots and reduce swelling in your legs.  Keep all follow-up visits as told by your health care provider. This is important. Contact a health care provider if:  Medicine does not help your pain.  Your pain gets worse.  You have new leg bruises or your leg bruises get bigger.  Your leg feels numb.  You have more redness, swelling, or pain around your incision.  You have fluid or blood coming from your incision.  Your incision feels warm to the touch.  You have pus or a bad smell coming from your incision.  You have a fever. Get help right away if:  Your pain is severe.  You develop pain, tenderness, warmth, redness, or swelling in any part of your leg.  You have chest pain.  You have trouble breathing. Summary  Raise (elevate) your legs above the level of your heart while you are sitting or lying down.  Wear compression stockings as told by your health care provider.  Make sure you know which symptoms should prompt you to contact your health care provider.  Keep all follow-up visits as told by your health care provider. This information is not intended to replace advice given to you by your health care provider. Make sure you discuss any questions you have with your health care provider. Document Revised: 06/01/2018 Document Reviewed: 06/01/2018 Elsevier Patient Education    2020 [employer] Coronary Artery Bypass Grafting, Care After This sheet gives you information about how to care for yourself after your procedure. Your doctor may also give you more specific instructions. If you have problems or questions, call your doctor. What can I expect after the procedure? After the procedure, it is common to:  Feel sick to your stomach (nauseous).  Not want to eat as much as  normal (lack of appetite).  Have trouble pooping (constipation).  Have weakness and tiredness (fatigue).  Feel sad (depressed) or grouchy (irritable).  Have pain or discomfort around the cuts from surgery (incisions). Follow these instructions at home: Medicines  Take over-the-counter and prescription medicines only as told by your doctor. Do not stop taking medicines or start any new medicines unless your doctor says it is okay.  If you were prescribed an antibiotic medicine, take it as told by your doctor. Do not stop taking the antibiotic even if you start to feel better. Incision care   Follow instructions from your doctor about how to take care of your cuts from surgery. Make sure you: ? Wash your hands with soap and water before and after you change your bandage (dressing). If you cannot use soap and water, use hand sanitizer. ? Change your bandage as told by your doctor. ? Leave stitches (sutures), skin glue, or skin tape (adhesive) strips in place. They may need to stay in place for 2 weeks or longer. If tape strips get loose and curl up, you may trim the loose edges. Do not remove tape strips completely unless your doctor says it is okay.  Make sure the surgery cuts are clean, dry, and protected.  Check your cut areas every day for signs of infection. Check for: ? More redness, swelling, or pain. ? More fluid or blood. ? Warmth. ? Pus or a bad smell.  If cuts were made in your legs: ? Avoid crossing your legs. ? Avoid sitting for long periods of time. Change positions every 30 minutes. ? Raise (elevate) your legs when you are sitting. Bathing  Do not take baths, swim, or use a hot tub until your doctor says it is okay.  You may take a shower Pat the surgery cuts dry. Do not rub the cuts to dry. Eating and drinking   Eat foods that are high in fiber, such as beans, nuts, whole grains, and raw fruits and vegetables. Any meats you eat should be lean cut. Avoid canned,  processed, and fried foods. This can help prevent trouble pooping. This is also a part of a heart-healthy diet.  Drink enough fluid to keep your pee (urine) pale yellow.  Do not drink alcohol until you are fully recovered. Ask your doctor when it is safe to drink alcohol. Activity  Rest and limit your activity as told by your doctor. You may be told to: ? Stop any activity right away if you have chest pain, shortness of breath, irregular heartbeats, or dizziness. Get help right away if you have any of these symptoms. ? Move around often for short periods or take short walks as told by your doctor. Slowly increase your activities. ? Avoid lifting, pushing, or pulling anything that is heavier than 10 lb (4.5 kg) for at least 6 weeks or as told by your doctor.  Do physical therapy or a cardiac rehab (cardiac rehabilitation) program as told by your doctor. ? Physical therapy involves doing exercises to maintain movement and build strength and endurance. ? A cardiac   rehab program includes:  Exercise training.  Education.  Counseling.  Do not drive until your doctor says it is okay.  Ask your doctor when you can go back to work.  Ask your doctor when you can be sexually active. General instructions  Do not drive or use heavy machinery while taking prescription pain medicine.  Do not use any products that contain nicotine or tobacco. These include cigarettes, e-cigarettes, and chewing tobacco. If you need help quitting, ask your doctor.  Take 2-3 deep breaths every few hours during the day while you get better. This helps expand your lungs and prevent problems.  If you were given a device called an incentive spirometer, use it several times a day to practice deep breathing. Support your chest with a pillow or your arms when you take deep breaths or cough.  Wear compression stockings as told by your doctor.  Weigh yourself every day. This helps to see if your body is holding  (retaining) fluid that may make your heart and lungs work harder.  Keep all follow-up visits as told by your doctor. This is important. Contact a doctor if:  You have more redness, swelling, or pain around any cut.  You have more fluid or blood coming from any cut.  Any cut feels warm to the touch.  You have pus or a bad smell coming from any cut.  You have a fever.  You have swelling in your ankles or legs.  You have pain in your legs.  You gain 2 lb (0.9 kg) or more a day.  You feel sick to your stomach or you throw up (vomit).  You have watery poop (diarrhea). Get help right away if:  You have chest pain that goes to your jaw or arms.  You are short of breath.  You have a fast or irregular heartbeat.  You notice a "clicking" in your breastbone (sternum) when you move.  You have any signs of a stroke. "BE FAST" is an easy way to remember the main warning signs: ? B - Balance. Signs are dizziness, sudden trouble walking, or loss of balance. ? E - Eyes. Signs are trouble seeing or a change in how you see. ? F - Face. Signs are sudden weakness or loss of feeling of the face, or the face or eyelid drooping on one side. ? A - Arms. Signs are weakness or loss of feeling in an arm. This happens suddenly and usually on one side of the body. ? S - Speech. Signs are sudden trouble speaking, slurred speech, or trouble understanding what people say. ? T - Time. Time to call emergency services. Write down what time symptoms started.  You have other signs of a stroke, such as: ? A sudden, very bad headache with no known cause. ? Feeling sick to your stomach. ? Throwing up. ? Jerky movements you cannot control (seizure). These symptoms may be an emergency. Do not wait to see if the symptoms will go away. Get medical help right away. Call your local emergency services (911 in the [place] Do not drive yourself to the hospital. Summary  After the procedure, it is common to have pain  or discomfort in the cuts from surgery (incisions).  Do not take baths, swim, or use a hot tub until your doctor says it is okay.  Slowly increase your activities. You may need physical therapy or cardiac rehab.  Weigh yourself every day. This helps to see if your body is holding fluid.   This information is not intended to replace advice given to you by your health care provider. Make sure you discuss any questions you have with your health care provider. Document Revised: 03/03/2018 Document Reviewed: 03/03/2018 Elsevier Patient Education  2020 [employer]  

## 2019-08-26 NOTE — Progress Notes (Addendum)
[address]       [place] [address]             [phone]      2 Days Post-Op Procedure(s) (LRB): CORONARY ARTERY BYPASS GRAFTING (CABG) times three, using left radial atery harvested endoscopically and right greater saphenous vein harvested endoscopically and left internal mammary artery. (N/A) RADIAL ARTERY HARVEST (Left) TRANSESOPHAGEAL ECHOCARDIOGRAM (TEE) (N/A) Subjective: Nausea improved, some chest soreness  Objective: Vital signs in last 24 hours: Temp:  [97.3 F (36.3 C)-98.4 F (36.9 C)] 98 F (36.7 C) (02/18 0400) Pulse Rate:  [80-107] 107 (02/18 0630) Cardiac Rhythm: Normal sinus rhythm;Atrial paced (02/18 0400) Resp:  [10-26] 21 (02/18 0630) BP: (108-138)/(56-74) 118/69 (02/18 0630) SpO2:  [96 %-100 %] 96 % (02/18 0630) Arterial Line BP: (120-177)/(45-86) 177/54 (02/17 1600) Weight:  [81.4 kg] 81.4 kg (02/18 0630)  Hemodynamic parameters for last 24 hours: PAP: (26-34)/(5-14) 29/5 CVP:  [8 mmHg] 8 mmHg CO:  [6.8 L/min] 6.8 L/min CI:  [3.5 L/min/m2] 3.5 L/min/m2  Intake/Output from previous day: 02/17 0701 - 02/18 0700 In: 343 [I.V.:182.3; IV Piggyback:160.7] Out: 2020 [Urine:1740; Chest Tube:280] Intake/Output this shift: No intake/output data recorded.  General appearance: alert, cooperative and no distress Heart: regular rate and rhythm Lungs: min dim in bases Abdomen: benign Extremities: no edema Wound: dressings CDI, left hand n/v intact  Lab Results: Recent Labs    08/25/19 1643 08/26/19 0218  WBC 19.0* 16.0*  HGB 10.8* 10.8*  HCT 32.0* 31.9*  PLT 179 172   BMET:  Recent Labs    08/25/19 1643 08/26/19 0218  NA 135 135  K 4.4 4.2  CL 103 103  CO2 20* 22  GLUCOSE 245* 232*  BUN 24* 21*  CREATININE 1.23 1.36*  CALCIUM 7.8* 7.8*    PT/INR:  Recent Labs    08/24/19 1443  LABPROT 17.6*  INR 1.5*   ABG    Component Value Date/Time   PHART 7.333 (L) 08/24/2019 1921   HCO3 23.4 08/24/2019 1921   TCO2 25 08/24/2019 1921   ACIDBASEDEF 2.0 08/24/2019 1921   O2SAT 99.0 08/24/2019 1921   CBG (last 3)  Recent Labs    08/25/19 2321 08/26/19 0200 08/26/19 0326  GLUCAP 210* 204* 172*    Meds Scheduled Meds: . acetaminophen  1,000 mg Oral Q6H   Or  . acetaminophen (TYLENOL) oral liquid 160 mg/5 mL  1,000 mg Per Tube Q6H  . aspirin EC  325 mg Oral Daily   Or  . aspirin  324 mg Per Tube Daily  . bisacodyl  10 mg Oral Daily   Or  . bisacodyl  10 mg Rectal Daily  . Chlorhexidine Gluconate Cloth  6 each Topical Daily  . docusate sodium  200 mg Oral Daily  . furosemide  20 mg Intravenous Daily  . insulin aspart  0-24 Units Subcutaneous Q4H  . insulin detemir  8 Units Subcutaneous BID  . insulin pump   Subcutaneous Q4H  . isosorbide mononitrate  15 mg Oral Daily  . latanoprost  1 drop Both Eyes QHS  . lisinopril  10 mg Oral Daily  . mouth rinse  15 mL Mouth Rinse BID  . metoCLOPramide (REGLAN) injection  10 mg Intravenous Q6H  . metoprolol tartrate  12.5 mg Oral BID   Or  . metoprolol tartrate  12.5 mg Per Tube BID  . pantoprazole  40 mg Oral Daily  . rosuvastatin  20 mg Oral QHS  .  sodium chloride flush  3 mL Intravenous Q12H  . STUDY - [facility] - [id number] or placebo 100 mg in dextrose 5% 100 mL (1 mg/mL) IV infusion (PI-Owen)  400 mL/hr Intravenous Q24H  . timolol  1 drop Both Eyes Daily   Continuous Infusions: . sodium chloride Stopped (08/24/19 2113)  . sodium chloride    . sodium chloride 10 mL/hr at 08/24/19 1507  . lactated ringers    . lactated ringers Stopped (08/25/19 1248)  . nitroGLYCERIN 5 mcg/min (08/26/19 0600)  . ondansetron (ZOFRAN) IV Stopped (08/25/19 1125)   PRN Meds:.sodium chloride, dextrose, fentaNYL (SUBLIMAZE) injection, labetalol, metoprolol tartrate, midazolam, ondansetron (ZOFRAN) IV, promethazine, sodium chloride flush, traMADol  Xrays DG Chest Port 1 View  Result Date: 08/26/2019 CLINICAL DATA:  Status post open heart surgery, chest tube  present. EXAM: PORTABLE CHEST 1 VIEW COMPARISON:  Radiograph yesterday. FINDINGS: Right internal jugular Swan-Ganz catheter is been removed, the sheath remains in place. Mediastinal drain remains. Left chest tube remains. No pneumothorax. Post median sternotomy and CABG with stable heart size. Streaky bibasilar atelectasis with small left pleural effusion. No pulmonary edema. IMPRESSION: 1. Left chest tube in place without pneumothorax. 2. Removal of right internal jugular Swan-Ganz catheter with sheath remaining in place. Mediastinal drain remains in place. 3. Post recent CABG with bibasilar atelectasis and minimal left pleural effusion. Electronically Signed   By: Narda Rutherford M.D.   On: 08/26/2019 05:45   DG Chest Port 1 View  Result Date: 08/25/2019 CLINICAL DATA:  Status post coronary artery bypass graft EXAM: PORTABLE CHEST 1 VIEW COMPARISON:  Chest radiograph from the prior day. FINDINGS: A right internal jugular swans Ganz catheter tip overlies the main pulmonary artery and is unchanged in position. A left-sided thoracostomy tube and a mediastinal drain are unchanged in position. Median sternotomy wires are redemonstrated. The heart size is normal. There is a mild bibasilar atelectasis. There is no pleural effusion or pneumothorax. IMPRESSION: 1. Mild bibasilar atelectasis. Electronically Signed   By: Romona Curls M.D.   On: 08/25/2019 09:04   DG Chest Port 1 View  Result Date: 08/24/2019 CLINICAL DATA:  Status post CABG today. EXAM: PORTABLE CHEST 1 VIEW COMPARISON:  PA and lateral chest 08/20/2019. FINDINGS: Endotracheal tube is in place with the tip in good position at the level of the clavicular heads. Right IJ approach Swan-Ganz catheter tip is in the distal pulmonary outflow tract. NG tube side port is in the distal esophagus. The tube should be advanced 5-6 cm for better positioning. Left chest tube and mediastinal drain are noted. Lungs are clear. Heart size is normal. No pneumothorax or  pleural effusion. IMPRESSION: NG tube side port is in the distal esophagus. The tube should be advanced 5-6 cm for better positioning. Swan-Ganz catheter tip is in the distal pulmonary outflow tract. Support apparatus is otherwise unremarkable. Clear lungs.  No pneumothorax. Electronically Signed   By: Drusilla Kanner M.D.   On: 08/24/2019 15:06   ECHO INTRAOPERATIVE TEE  Result Date: 08/24/2019  *INTRAOPERATIVE TRANSESOPHAGEAL REPORT *  Patient Name:   Harry Moore  Date of Exam: 08/24/2019 Medical Rec #:  [id number]      Height:       70.0 in Accession #:    [id number]     Weight:       175.3 lb Date of Birth:  July 23, 1960      BSA:          1.97 m Patient Age:    59  years       BP:           151/71 mmHg Patient Gender: M              HR:           78 bpm. Exam Location:  Anesthesiology Transesophogeal exam was perform intraoperatively during surgical procedure. Patient was closely monitored under general anesthesia during the entirety of examination. Indications:     Coronary artery disease Performing Phys: [address] Matusik VAN TRIGT Diagnosing Phys: Gaynelle Adu MD Complications: No known complications during this procedure. POST-OP IMPRESSIONS Overall, there were no significant changes from pre-bypass. PRE-OP FINDINGS  Left Ventricle: The left ventricle has normal systolic function, with an ejection fraction of 60-65%. The cavity size was left ventricular size was not assessed. There is no increase in left ventricular wall thickness. Right Ventricle: The right ventricle has normal systolic function. The cavity was not assessed. There is no increase in right ventricular wall thickness. Left Atrium: Left atrial size was not assessed. The left atrial appendage is well visualized and there is no evidence of thrombus present. Right Atrium: Right atrial size was not assessed. Right atrial pressure is estimated at 10 mmHg. Interatrial Septum: No atrial level shunt detected by color flow Doppler. Pericardium: There  is no evidence of pericardial effusion. Mitral Valve: The mitral valve is normal in structure. No thickening of the mitral valve leaflet. No calcification of the mitral valve leaflet. Mitral valve regurgitation is not visualized by color flow Doppler. Tricuspid Valve: The tricuspid valve was normal in structure. Tricuspid valve regurgitation is trivial by color flow Doppler. Aortic Valve: The aortic valve is normal in structure. Aortic valve regurgitation was not visualized by color flow Doppler. Pulmonic Valve: The pulmonic valve was normal in structure. Pulmonic valve regurgitation is trivial by color flow Doppler.  Gaynelle Adu MD Electronically signed by Gaynelle Adu MD Signature Date/Time: 08/24/2019/2:20:04 PM    Final     Assessment/Plan: S/P Procedure(s) (LRB): CORONARY ARTERY BYPASS GRAFTING (CABG) times three, using left radial atery harvested endoscopically and right greater saphenous vein harvested endoscopically and left internal mammary artery. (N/A) RADIAL ARTERY HARVEST (Left) TRANSESOPHAGEAL ECHOCARDIOGRAM (TEE) (N/A)  1 doing well POD #2 2 hemodyn stable, minor HTN at times, slight increase in creat, will monitor and hopefully be able to continue ACE-I. IMDUR for rad art harvest, wean NTG off 3 minor CT drainage, d/c tubes 4 d/c foley 5 pain and nausea management 6 H/H stable 7 BS adeq control, hopefully start pump with improvement of nausea/appetite 8 leukocytosis improved  9 routine rehab/pulm toilet  LOS: 2 days    Rowe Clack PA-C 08/26/2019 Pager [phone]  Transition to insulin pump Transfer to stepdown patient examined and medical record reviewed,agree with above note. Kathlee Nations Trigt III 08/26/2019

## 2019-08-26 NOTE — Progress Notes (Signed)
Inpatient Diabetes Program Recommendations  AACE/ADA: New Consensus Statement on Inpatient Glycemic Control (2015)  Target Ranges:  Prepandial:   less than 140 mg/dL      Peak postprandial:   less than 180 mg/dL (1-2 hours)      Critically ill patients:  140 - 180 mg/dL   Lab Results  Component Value Date   GLUCAP 277 (H) 08/26/2019   HGBA1C 7.3 (H) 08/20/2019    Review of Glycemic Control Results for Harry Moore, Harry Moore (MRN [id number]) as of 08/26/2019 08:55  Ref. Range 08/25/2019 23:21 08/26/2019 02:00 08/26/2019 03:26 08/26/2019 08:02  Glucose-Capillary Latest Ref Range: 70 - 99 mg/dL 184 (H) 859 (H) 276 (H) 277 (H)   Diabetes history: Type 1 DM Outpatient Diabetes medications: Medronic 670G  Current orders for Inpatient glycemic control: insulin pump Q4H, Novolog 0-24 units Q4H, Levemir 8 units BID  Inpatient Diabetes Program Recommendations:    Consider increasing Levemir to 12 units BID (to start this AM). Will evaluate patient today for insulin pump placement.   Thanks, Lujean Rave, MSN, RNC-OB Diabetes Coordinator [phone] (8a-5p)

## 2019-08-27 ENCOUNTER — Inpatient Hospital Stay (HOSPITAL_COMMUNITY): Payer: BC Managed Care – PPO

## 2019-08-27 DIAGNOSIS — E1042 Type 1 diabetes mellitus with diabetic polyneuropathy: Secondary | ICD-10-CM | POA: Diagnosis not present

## 2019-08-27 DIAGNOSIS — I25110 Atherosclerotic heart disease of native coronary artery with unstable angina pectoris: Secondary | ICD-10-CM | POA: Diagnosis not present

## 2019-08-27 DIAGNOSIS — D62 Acute posthemorrhagic anemia: Secondary | ICD-10-CM | POA: Diagnosis not present

## 2019-08-27 DIAGNOSIS — J9811 Atelectasis: Secondary | ICD-10-CM | POA: Diagnosis not present

## 2019-08-27 DIAGNOSIS — I4891 Unspecified atrial fibrillation: Secondary | ICD-10-CM | POA: Diagnosis not present

## 2019-08-27 LAB — BASIC METABOLIC PANEL
Anion gap: 10 (ref 5–15)
BUN: 18 mg/dL (ref 6–20)
CO2: 25 mmol/L (ref 22–32)
Calcium: 8.1 mg/dL — ABNORMAL LOW (ref 8.9–10.3)
Chloride: 105 mmol/L (ref 98–111)
Creatinine, Ser: 0.98 mg/dL (ref 0.61–1.24)
GFR calc Af Amer: >60 mL/min (ref >60)
GFR calc non Af Amer: >60 mL/min (ref >60)
Glucose, Bld: 134 mg/dL — ABNORMAL HIGH (ref 70–99)
Potassium: 4.3 mmol/L (ref 3.5–5.1)
Sodium: 140 mmol/L (ref 135–145)

## 2019-08-27 LAB — CBC
HCT: 31.6 % — ABNORMAL LOW (ref 39.0–52.0)
Hemoglobin: 10.7 g/dL — ABNORMAL LOW (ref 13.0–17.0)
MCH: 30.3 pg (ref 26.0–34.0)
MCHC: 33.9 g/dL (ref 30.0–36.0)
MCV: 89.5 fL (ref 80.0–100.0)
Platelets: 169 10*3/uL (ref 150–400)
RBC: 3.53 MIL/uL — ABNORMAL LOW (ref 4.22–5.81)
RDW: 12.5 % (ref 11.5–15.5)
WBC: 10.4 10*3/uL (ref 4.0–10.5)
nRBC: 0 % (ref 0.0–0.2)

## 2019-08-27 LAB — GLUCOSE, CAPILLARY
Glucose-Capillary: 132 mg/dL — ABNORMAL HIGH (ref 70–99)
Glucose-Capillary: 142 mg/dL — ABNORMAL HIGH (ref 70–99)
Glucose-Capillary: 150 mg/dL — ABNORMAL HIGH (ref 70–99)
Glucose-Capillary: 164 mg/dL — ABNORMAL HIGH (ref 70–99)
Glucose-Capillary: 175 mg/dL — ABNORMAL HIGH (ref 70–99)
Glucose-Capillary: 189 mg/dL — ABNORMAL HIGH (ref 70–99)

## 2019-08-27 MED ORDER — LISINOPRIL 10 MG PO TABS
20.0000 mg | ORAL_TABLET | Freq: Every day | ORAL | Status: DC
  Administered 2019-08-28 – 2019-08-29 (×2): 20 mg via ORAL
  Filled 2019-08-27 (×2): qty 2

## 2019-08-27 MED FILL — Electrolyte-R (PH 7.4) Solution: INTRAVENOUS | Qty: 4000 | Status: AC

## 2019-08-27 MED FILL — Magnesium Sulfate Inj 50%: INTRAMUSCULAR | Qty: 10 | Status: AC

## 2019-08-27 MED FILL — Heparin Sodium (Porcine) Inj 1000 Unit/ML: INTRAMUSCULAR | Qty: 30 | Status: AC

## 2019-08-27 MED FILL — Sodium Bicarbonate IV Soln 8.4%: INTRAVENOUS | Qty: 50 | Status: AC

## 2019-08-27 MED FILL — Potassium Chloride Inj 2 mEq/ML: INTRAVENOUS | Qty: 40 | Status: AC

## 2019-08-27 MED FILL — Sodium Chloride IV Soln 0.9%: INTRAVENOUS | Qty: 2000 | Status: AC

## 2019-08-27 MED FILL — Vancomycin HCl For IV Soln 1 GM (Base Equivalent): INTRAVENOUS | Qty: 1000 | Status: CN

## 2019-08-27 MED FILL — Heparin Sodium (Porcine) Inj 1000 Unit/ML: INTRAMUSCULAR | Qty: 20 | Status: AC

## 2019-08-27 MED FILL — Lidocaine HCl Local Preservative Free (PF) Inj 2%: INTRAMUSCULAR | Qty: 15 | Status: AC

## 2019-08-27 MED FILL — Mannitol IV Soln 20%: INTRAVENOUS | Qty: 500 | Status: AC

## 2019-08-27 MED FILL — Lidocaine HCl Local Soln Prefilled Syringe 100 MG/5ML (2%): INTRAMUSCULAR | Qty: 10 | Status: AC

## 2019-08-27 NOTE — Telephone Encounter (Signed)
Called patient - he is still in the hospital - had CABG on Tuesday 2/17

## 2019-08-27 NOTE — Progress Notes (Addendum)
[address]       [place] [address]             [phone]      3 Days Post-Op Procedure(s) (LRB): CORONARY ARTERY BYPASS GRAFTING (CABG) times three, using left radial atery harvested endoscopically and right greater saphenous vein harvested endoscopically and left internal mammary artery. (N/A) RADIAL ARTERY HARVEST (Left) TRANSESOPHAGEAL ECHOCARDIOGRAM (TEE) (N/A) Subjective: Didn't sleep very well, but conts to feel pretty well, pain well controlled  Objective: Vital signs in last 24 hours: Temp:  [98.3 F (36.8 C)-99.9 F (37.7 C)] 98.3 F (36.8 C) (02/19 0715) Pulse Rate:  [94-110] 104 (02/19 0715) Cardiac Rhythm: Normal sinus rhythm;Sinus tachycardia (02/18 2121) Resp:  [16-21] 20 (02/19 0715) BP: (109-148)/(62-86) 148/86 (02/19 0715) SpO2:  [95 %-99 %] 98 % (02/19 0715) Weight:  [79 kg] 79 kg (02/19 0442)  Hemodynamic parameters for last 24 hours:    Intake/Output from previous day: 02/18 0701 - 02/19 0700 In: 524.9 [P.O.:420; I.V.:104.9] Out: 2095 [Urine:1975; Chest Tube:120] Intake/Output this shift: No intake/output data recorded.  General appearance: alert, cooperative and no distress Heart: regular rate and rhythm Lungs: clear to auscultation bilaterally Abdomen: benign Extremities: no edema, left hand N/V intact Wound: EVH/ERH sites look ok, chest dressing in pace  Lab Results: Recent Labs    08/26/19 0218 08/27/19 0249  WBC 16.0* 10.4  HGB 10.8* 10.7*  HCT 31.9* 31.6*  PLT 172 169   BMET:  Recent Labs    08/26/19 0218 08/27/19 0249  NA 135 140  K 4.2 4.3  CL 103 105  CO2 22 25  GLUCOSE 232* 134*  BUN 21* 18  CREATININE 1.36* 0.98  CALCIUM 7.8* 8.1*    PT/INR:  Recent Labs    08/24/19 1443  LABPROT 17.6*  INR 1.5*   ABG    Component Value Date/Time   PHART 7.333 (L) 08/24/2019 1921   HCO3 23.4 08/24/2019 1921   TCO2 25 08/24/2019 1921   ACIDBASEDEF 2.0 08/24/2019 1921   O2SAT 99.0 08/24/2019  1921   CBG (last 3)  Recent Labs    08/27/19 0023 08/27/19 0441 08/27/19 0717  GLUCAP 150* 132* 142*    Meds Scheduled Meds: . acetaminophen  1,000 mg Oral Q6H   Or  . acetaminophen (TYLENOL) oral liquid 160 mg/5 mL  1,000 mg Per Tube Q6H  . aspirin EC  81 mg Oral Daily  . bisacodyl  10 mg Oral Daily   Or  . bisacodyl  10 mg Rectal Daily  . Chlorhexidine Gluconate Cloth  6 each Topical Daily  . clopidogrel  75 mg Oral Daily  . docusate sodium  200 mg Oral Daily  . enoxaparin (LOVENOX) injection  40 mg Subcutaneous Q24H  . furosemide  40 mg Oral Daily  . insulin pump   Subcutaneous Q4H  . isosorbide mononitrate  30 mg Oral Daily  . latanoprost  1 drop Both Eyes QHS  . lisinopril  10 mg Oral Daily  . mouth rinse  15 mL Mouth Rinse BID  . metoCLOPramide (REGLAN) injection  10 mg Intravenous Q6H  . metoprolol tartrate  25 mg Oral BID  . pantoprazole  40 mg Oral Daily  . rosuvastatin  20 mg Oral QHS  . sodium chloride flush  3 mL Intravenous Q12H  . timolol  1 drop Both Eyes Daily   Continuous Infusions: . ondansetron (ZOFRAN) IV Stopped (08/25/19 1125)   PRN Meds:.fentaNYL (SUBLIMAZE) injection, labetalol, metoprolol  tartrate, ondansetron (ZOFRAN) IV, promethazine, sodium chloride flush, traMADol  Xrays DG Chest Port 1 View  Result Date: 08/27/2019 CLINICAL DATA:  CABG 3 days ago. EXAM: PORTABLE CHEST 1 VIEW COMPARISON:  Radiograph yesterday. FINDINGS: Mediastinal drain, left chest tube, and right internal jugular sheath has been removed. Possible trace left apical pneumothorax. Post median sternotomy and CABG with stable heart size. Improving bibasilar atelectasis. Resolved pleural effusion. Epicardial leads are seen. IMPRESSION: 1. Possible trace left apical pneumothorax after chest tube removal. 2. Removal of mediastinal drain, left chest tube, and right internal jugular sheath. 3. Improved bibasilar atelectasis. Electronically Signed   By: Narda Rutherford M.D.   On:  08/27/2019 06:16   DG Chest Port 1 View  Result Date: 08/26/2019 CLINICAL DATA:  Status post open heart surgery, chest tube present. EXAM: PORTABLE CHEST 1 VIEW COMPARISON:  Radiograph yesterday. FINDINGS: Right internal jugular Swan-Ganz catheter is been removed, the sheath remains in place. Mediastinal drain remains. Left chest tube remains. No pneumothorax. Post median sternotomy and CABG with stable heart size. Streaky bibasilar atelectasis with small left pleural effusion. No pulmonary edema. IMPRESSION: 1. Left chest tube in place without pneumothorax. 2. Removal of right internal jugular Swan-Ganz catheter with sheath remaining in place. Mediastinal drain remains in place. 3. Post recent CABG with bibasilar atelectasis and minimal left pleural effusion. Electronically Signed   By: Narda Rutherford M.D.   On: 08/26/2019 05:45    Assessment/Plan: S/P Procedure(s) (LRB): CORONARY ARTERY BYPASS GRAFTING (CABG) times three, using left radial atery harvested endoscopically and right greater saphenous vein harvested endoscopically and left internal mammary artery. (N/A) RADIAL ARTERY HARVEST (Left) TRANSESOPHAGEAL ECHOCARDIOGRAM (TEE) (N/A)  1 doing well POD #3 2 hemodyn stable , a little hypertensive and mildly tachy at times- will increase metoprolol dose to 37.5 bid. On imdur for radial artery. Cont current lisinopril dose for now 3 normal renal fxn, good UOP, will be able to stop lasix soon 4 H/H stable 5 leukocytosis resolved 6 D/C epw's in am  7 cont routine pulm toilet/rehab, CXR improved appearance 8 BS- back on insulin pump 9 poss home on Sun/Mon  LOS: 3 days    Rowe Clack PA-C 08/27/2019 Pager [phone]  Resume home dose lisinopril 20 mg/day tomorrow Remove epicardial wires tomorrow Plan DC home Sunday if he remains stable patient examined and medical record reviewed,agree with above note. Kathlee Nations Trigt III 08/27/2019

## 2019-08-27 NOTE — Progress Notes (Signed)
CARDIAC REHAB PHASE I   PRE:  Rate/Rhythm: 109 ST  BP:  Supine:   Sitting: 141/73  Standing:    SaO2: 99%RA  MODE:  Ambulation: 470 ft   POST:  Rate/Rhythm: 122 ST  BP:  Supine:   Sitting: 143/92  Standing:    SaO2: 99%RA 0937-1035 Pt walked 470 ft on RA with hand held asst. Gait steady and tolerated well. Discussed importance of sternal precautions and staying in the tube, IS, walking for exercise, and gave diabetic and heart healthy diets. Discussed CRP 2 and will send referral letter to [facility]. Wrote down how to view discharge video. Pt voiced understanding of ed. To recliner with call bell.    Luetta Nutting, RN BSN  08/27/2019 10:30 AM

## 2019-08-27 NOTE — Research (Addendum)
[facility]each Study  Visit 5  Visit 5 labs and urine collected.      ABNORMAL LABS   [x]    No change or change not clinically significant. NO FOLLOW UP REQUIRED. []    Change clinically significant and attributable to disease or management. NO FOLLOW UP  REQUIRED. []    Change clinically significant and possibly attributable to study medication. NO FOLLOW UP REQUIRED. []    Change clinically significant and attributable to study medication. FOLLOW UP REQUIRED. []    Apparent lab error. []    Unevaluable.

## 2019-08-27 NOTE — Research (Addendum)
[facility] Research Study  Visit 4   Visit 4 labs and urine collected @ 1125  EOI collected @ 1150  2-4 hour labs collected @ 1400.      ABNORMAL LABS   []    No change or change not clinically significant. NO FOLLOW UP REQUIRED. [x]    Change clinically significant and attributable to disease or management. NO FOLLOW UP  REQUIRED. []    Change clinically significant and possibly attributable to study medication. NO FOLLOW UP REQUIRED. []    Change clinically significant and attributable to study medication. FOLLOW UP REQUIRED. []    Apparent lab error. []    Unevaluable.

## 2019-08-28 ENCOUNTER — Inpatient Hospital Stay (HOSPITAL_COMMUNITY): Payer: BC Managed Care – PPO

## 2019-08-28 DIAGNOSIS — I25810 Atherosclerosis of coronary artery bypass graft(s) without angina pectoris: Secondary | ICD-10-CM | POA: Diagnosis not present

## 2019-08-28 DIAGNOSIS — I4891 Unspecified atrial fibrillation: Secondary | ICD-10-CM | POA: Diagnosis not present

## 2019-08-28 DIAGNOSIS — I25110 Atherosclerotic heart disease of native coronary artery with unstable angina pectoris: Secondary | ICD-10-CM | POA: Diagnosis not present

## 2019-08-28 DIAGNOSIS — D62 Acute posthemorrhagic anemia: Secondary | ICD-10-CM | POA: Diagnosis not present

## 2019-08-28 DIAGNOSIS — E1042 Type 1 diabetes mellitus with diabetic polyneuropathy: Secondary | ICD-10-CM | POA: Diagnosis not present

## 2019-08-28 LAB — TYPE AND SCREEN
ABO/RH(D): A POS
Antibody Screen: NEGATIVE
Unit division: 0
Unit division: 0
Unit division: 0
Unit division: 0

## 2019-08-28 LAB — CBC
HCT: 31.2 % — ABNORMAL LOW (ref 39.0–52.0)
Hemoglobin: 10.8 g/dL — ABNORMAL LOW (ref 13.0–17.0)
MCH: 30.7 pg (ref 26.0–34.0)
MCHC: 34.6 g/dL (ref 30.0–36.0)
MCV: 88.6 fL (ref 80.0–100.0)
Platelets: 209 K/uL (ref 150–400)
RBC: 3.52 MIL/uL — ABNORMAL LOW (ref 4.22–5.81)
RDW: 12.3 % (ref 11.5–15.5)
WBC: 8.6 K/uL (ref 4.0–10.5)
nRBC: 0 % (ref 0.0–0.2)

## 2019-08-28 LAB — BPAM RBC
Blood Product Expiration Date: 202103182359
Blood Product Expiration Date: 202103182359
Blood Product Expiration Date: 202103182359
Blood Product Expiration Date: 202103192359
ISSUE DATE / TIME: 202102161047
ISSUE DATE / TIME: 202102161047
Unit Type and Rh: 6200
Unit Type and Rh: 6200
Unit Type and Rh: 6200
Unit Type and Rh: 6200

## 2019-08-28 LAB — GLUCOSE, CAPILLARY
Glucose-Capillary: 120 mg/dL — ABNORMAL HIGH (ref 70–99)
Glucose-Capillary: 144 mg/dL — ABNORMAL HIGH (ref 70–99)
Glucose-Capillary: 150 mg/dL — ABNORMAL HIGH (ref 70–99)
Glucose-Capillary: 163 mg/dL — ABNORMAL HIGH (ref 70–99)
Glucose-Capillary: 183 mg/dL — ABNORMAL HIGH (ref 70–99)
Glucose-Capillary: 206 mg/dL — ABNORMAL HIGH (ref 70–99)
Glucose-Capillary: 209 mg/dL — ABNORMAL HIGH (ref 70–99)

## 2019-08-28 LAB — BASIC METABOLIC PANEL WITH GFR
Anion gap: 13 (ref 5–15)
BUN: 19 mg/dL (ref 6–20)
CO2: 23 mmol/L (ref 22–32)
Calcium: 8 mg/dL — ABNORMAL LOW (ref 8.9–10.3)
Chloride: 103 mmol/L (ref 98–111)
Creatinine, Ser: 1.08 mg/dL (ref 0.61–1.24)
GFR calc Af Amer: >60 mL/min
GFR calc non Af Amer: >60 mL/min
Glucose, Bld: 144 mg/dL — ABNORMAL HIGH (ref 70–99)
Potassium: 3.3 mmol/L — ABNORMAL LOW (ref 3.5–5.1)
Sodium: 139 mmol/L (ref 135–145)

## 2019-08-28 LAB — MAGNESIUM: Magnesium: 1.6 mg/dL — ABNORMAL LOW (ref 1.7–2.4)

## 2019-08-28 MED ORDER — MAGNESIUM OXIDE 400 (241.3 MG) MG PO TABS: 400.0000 mg | ORAL_TABLET | Freq: Two times a day (BID) | ORAL | Status: DC

## 2019-08-28 MED ORDER — GLUCERNA SHAKE PO LIQD
237.0000 mL | Freq: Three times a day (TID) | ORAL | Status: DC
  Administered 2019-08-28 – 2019-08-29 (×5): 237 mL via ORAL

## 2019-08-28 MED ORDER — POTASSIUM CHLORIDE CRYS ER 20 MEQ PO TBCR
30.0000 meq | EXTENDED_RELEASE_TABLET | Freq: Two times a day (BID) | ORAL | Status: AC
  Administered 2019-08-28 (×2): 30 meq via ORAL
  Filled 2019-08-28 (×2): qty 1

## 2019-08-28 MED ORDER — MAGNESIUM SULFATE 4 GM/100ML IV SOLN
4.0000 g | Freq: Once | INTRAVENOUS | Status: AC
  Administered 2019-08-28: 4 g via INTRAVENOUS
  Filled 2019-08-28: qty 100

## 2019-08-28 MED ORDER — METOPROLOL TARTRATE 25 MG PO TABS
37.5000 mg | ORAL_TABLET | Freq: Two times a day (BID) | ORAL | Status: DC
  Administered 2019-08-28 (×2): 37.5 mg via ORAL
  Filled 2019-08-28 (×2): qty 1

## 2019-08-28 NOTE — Progress Notes (Signed)
Patient has episode of on and off A fib/ A flutter HR at 150 s to 160, patient is asymptomatic.Metoprolol 2.5 mg IV given,patient rhythm back to SR,ST with HR at 90s to 100 s per min.Will continue to closely monitor the patient.

## 2019-08-28 NOTE — Research (Addendum)
[facility] Research Study  Visit 6  Visit 6 labs and urine collected.       ABNORMAL LABS   [x]    No change or change not clinically significant. NO FOLLOW UP REQUIRED. []    Change clinically significant and attributable to disease or management. NO FOLLOW UP  REQUIRED. []    Change clinically significant and possibly attributable to study medication. NO FOLLOW UP REQUIRED. []    Change clinically significant and attributable to study medication. FOLLOW UP REQUIRED. []    Apparent lab error. []    Unevaluable.

## 2019-08-28 NOTE — Plan of Care (Signed)
Poc progressing.  

## 2019-08-28 NOTE — Progress Notes (Signed)
Patient is A fib/ Aflutter in monitor rate at 150 s to 170 s, captured by 12 leads EKG.patient is asymptomatic.B/P is 151/74,O2 saturation is 98% on room air Metoprolol 5 mg IV given.Patient rhythm back to ST/SR, HR at 90 s to 100s. Will continue to monitor closely.

## 2019-08-28 NOTE — Progress Notes (Signed)
CARDIAC REHAB PHASE I   PRE:  Rate/Rhythm: 100 SR  BP:  Supine: 144/90  Sitting:   Standing:    SaO2: 98 RA  MODE:  Ambulation: 470 ft   POST:  Rate/Rhythm: 114 ST  BP:  Supine:   Sitting: 145/87  Standing:    SaO2: 97 RA 1030-1115 On arrival pt in bed. He states that he has not walked today. He is somewhat anxious that he went in to A Fib. I encouraged him that it is important to walk here so that we could see if this would return. He voiced understanding and was willing to walk. He tolerated ambulation well without c/o. HR was 100-114 SR ST before, during and after walk. Pt walked 470 feet and he requested to go back to bed after walk. Call light in reach.  Melina Copa RN 08/28/2019 11:09 AM

## 2019-08-28 NOTE — Progress Notes (Addendum)
      [address]       [place] [address]             [phone]      4 Days Post-Op Procedure(s) (LRB): CORONARY ARTERY BYPASS GRAFTING (CABG) times three, using left radial atery harvested endoscopically and right greater saphenous vein harvested endoscopically and left internal mammary artery. (N/A) RADIAL ARTERY HARVEST (Left) TRANSESOPHAGEAL ECHOCARDIOGRAM (TEE) (N/A) Subjective: Felt scared when he went into afib earlier with a rate of 170. Now feeling better. Plans to walk this afternoon.   Objective: Vital signs in last 24 hours: Temp:  [98 F (36.7 C)-100 F (37.8 C)] 98.2 F (36.8 C) (02/20 0756) Pulse Rate:  [88-110] 107 (02/20 0756) Cardiac Rhythm: Sinus tachycardia (02/20 0425) Resp:  [18-20] 20 (02/20 0425) BP: (118-151)/(71-84) 150/81 (02/20 0756) SpO2:  [96 %-100 %] 96 % (02/20 0756) Weight:  [77.9 kg] 77.9 kg (02/20 0410)     Intake/Output from previous day: 02/19 0701 - 02/20 0700 In: 240 [P.O.:240] Out: 1225 [Urine:1225] Intake/Output this shift: Total I/O In: 240 [P.O.:240] Out: -   General appearance: alert, cooperative and no distress Heart: sinus tachycardia Lungs: clear to auscultation bilaterally Abdomen: soft, non-tender; bowel sounds normal; no masses,  no organomegaly Extremities: extremities normal, atraumatic, no cyanosis or edema Wound: clean and dry  Lab Results: Recent Labs    08/27/19 0249 08/28/19 0309  WBC 10.4 8.6  HGB 10.7* 10.8*  HCT 31.6* 31.2*  PLT 169 209   BMET:  Recent Labs    08/27/19 0249 08/28/19 0309  NA 140 139  K 4.3 3.3*  CL 105 103  CO2 25 23  GLUCOSE 134* 144*  BUN 18 19  CREATININE 0.98 1.08  CALCIUM 8.1* 8.0*    PT/INR: No results for input(s): LABPROT, INR in the last 72 hours. ABG    Component Value Date/Time   PHART 7.333 (L) 08/24/2019 1921   HCO3 23.4 08/24/2019 1921   TCO2 25 08/24/2019 1921   ACIDBASEDEF 2.0 08/24/2019 1921   O2SAT 99.0 08/24/2019 1921    CBG (last 3)  Recent Labs    08/28/19 0011 08/28/19 0409 08/28/19 0801  GLUCAP 150* 144* 209*    Assessment/Plan: S/P Procedure(s) (LRB): CORONARY ARTERY BYPASS GRAFTING (CABG) times three, using left radial atery harvested endoscopically and right greater saphenous vein harvested endoscopically and left internal mammary artery. (N/A) RADIAL ARTERY HARVEST (Left) TRANSESOPHAGEAL ECHOCARDIOGRAM (TEE) (N/A)  1. CV- Afib. Twice last night and converted with IV lopressor. ST this morning with rate of 100. Will replace potassium, order a magnesium level and increase the oral metoprolol.  Continue to follow rhythm closely and will add amio if needed.  2. Pulm-Tolerating room air with excellent saturation.  3. Renal-creatinine 1.08, hypokalemia-will replace.  4. H and H stable 10.8/31.2, expected acute blood loss anemia 5. Endo-continue insulin pump. Blood glucose variable.  6. Continue plavix and ASA 81. 7. GI- nausea has resolved but still not eating much due to the food. Will order some glucerna for the patient to try.   Plan: Keep wires while rhythm is unstable. Will check back after medication changes today. Patient would like to avoid Amio if possible due to the GI side effects.    LOS: 4 days    Sharlene Dory 08/28/2019 Patient seen and examined, agree with above Transient atrial fib- increase metoprolol as noted  Viviann Spare C. Dorris Fetch, MD [facility] [phone]

## 2019-08-29 LAB — BASIC METABOLIC PANEL
Anion gap: 12 (ref 5–15)
BUN: 16 mg/dL (ref 6–20)
CO2: 24 mmol/L (ref 22–32)
Calcium: 8.2 mg/dL — ABNORMAL LOW (ref 8.9–10.3)
Chloride: 105 mmol/L (ref 98–111)
Creatinine, Ser: 1.01 mg/dL (ref 0.61–1.24)
GFR calc Af Amer: >60 mL/min (ref >60)
GFR calc non Af Amer: >60 mL/min (ref >60)
Glucose, Bld: 155 mg/dL — ABNORMAL HIGH (ref 70–99)
Potassium: 3.9 mmol/L (ref 3.5–5.1)
Sodium: 141 mmol/L (ref 135–145)

## 2019-08-29 LAB — GLUCOSE, CAPILLARY
Glucose-Capillary: 143 mg/dL — ABNORMAL HIGH (ref 70–99)
Glucose-Capillary: 150 mg/dL — ABNORMAL HIGH (ref 70–99)
Glucose-Capillary: 172 mg/dL — ABNORMAL HIGH (ref 70–99)
Glucose-Capillary: 195 mg/dL — ABNORMAL HIGH (ref 70–99)
Glucose-Capillary: 267 mg/dL — ABNORMAL HIGH (ref 70–99)
Glucose-Capillary: 283 mg/dL — ABNORMAL HIGH (ref 70–99)

## 2019-08-29 LAB — CBC
HCT: 31.4 % — ABNORMAL LOW (ref 39.0–52.0)
Hemoglobin: 10.8 g/dL — ABNORMAL LOW (ref 13.0–17.0)
MCH: 31 pg (ref 26.0–34.0)
MCHC: 34.4 g/dL (ref 30.0–36.0)
MCV: 90.2 fL (ref 80.0–100.0)
Platelets: 246 10*3/uL (ref 150–400)
RBC: 3.48 MIL/uL — ABNORMAL LOW (ref 4.22–5.81)
RDW: 12.2 % (ref 11.5–15.5)
WBC: 8.1 10*3/uL (ref 4.0–10.5)
nRBC: 0 % (ref 0.0–0.2)

## 2019-08-29 LAB — MAGNESIUM: Magnesium: 1.9 mg/dL (ref 1.7–2.4)

## 2019-08-29 MED ORDER — METOPROLOL TARTRATE 50 MG PO TABS
50.0000 mg | ORAL_TABLET | Freq: Two times a day (BID) | ORAL | Status: DC
  Administered 2019-08-29 – 2019-08-30 (×3): 50 mg via ORAL
  Filled 2019-08-29 (×3): qty 1

## 2019-08-29 MED ORDER — ZOLPIDEM TARTRATE 5 MG PO TABS: 5.0000 mg | ORAL_TABLET | Freq: Every evening | ORAL | Status: DC | PRN

## 2019-08-29 MED ORDER — DIPHENHYDRAMINE HCL 25 MG PO CAPS
25.0000 mg | ORAL_CAPSULE | Freq: Every evening | ORAL | Status: DC | PRN
  Administered 2019-08-29: 25 mg via ORAL
  Filled 2019-08-29: qty 1

## 2019-08-29 NOTE — Progress Notes (Addendum)
      [address]       [place] [address]             [phone]      5 Days Post-Op Procedure(s) (LRB): CORONARY ARTERY BYPASS GRAFTING (CABG) times three, using left radial atery harvested endoscopically and right greater saphenous vein harvested endoscopically and left internal mammary artery. (N/A) RADIAL ARTERY HARVEST (Left) TRANSESOPHAGEAL ECHOCARDIOGRAM (TEE) (N/A) Subjective: Feels okay this morning.  Asking for something for sleep.  Objective: Vital signs in last 24 hours: Temp:  [97.9 F (36.6 C)-98.5 F (36.9 C)] 97.9 F (36.6 C) (02/21 0757) Pulse Rate:  [89-107] 107 (02/21 0757) Cardiac Rhythm: Normal sinus rhythm (02/21 0411) Resp:  [17-24] 17 (02/21 0757) BP: (112-160)/(65-87) 160/87 (02/21 0757) SpO2:  [95 %-97 %] 96 % (02/21 0757)     Intake/Output from previous day: 02/20 0701 - 02/21 0700 In: 730 [P.O.:630; IV Piggyback:100] Out: 450 [Urine:450] Intake/Output this shift: No intake/output data recorded.  General appearance: alert, cooperative and no distress Heart: sinus tachycardia Lungs: clear to auscultation bilaterally Abdomen: soft, non-tender; bowel sounds normal; no masses,  no organomegaly Extremities: extremities normal, atraumatic, no cyanosis or edema Wound: clean and dry  Lab Results: Recent Labs    08/28/19 0309 08/29/19 0259  WBC 8.6 8.1  HGB 10.8* 10.8*  HCT 31.2* 31.4*  PLT 209 246   BMET:  Recent Labs    08/28/19 0309 08/29/19 0259  NA 139 141  K 3.3* 3.9  CL 103 105  CO2 23 24  GLUCOSE 144* 155*  BUN 19 16  CREATININE 1.08 1.01  CALCIUM 8.0* 8.2*    PT/INR: No results for input(s): LABPROT, INR in the last 72 hours. ABG    Component Value Date/Time   PHART 7.333 (L) 08/24/2019 1921   HCO3 23.4 08/24/2019 1921   TCO2 25 08/24/2019 1921   ACIDBASEDEF 2.0 08/24/2019 1921   O2SAT 99.0 08/24/2019 1921   CBG (last 3)  Recent Labs    08/28/19 2346 08/29/19 0407 08/29/19 0759  GLUCAP  120* 150* 172*    Assessment/Plan: S/P Procedure(s) (LRB): CORONARY ARTERY BYPASS GRAFTING (CABG) times three, using left radial atery harvested endoscopically and right greater saphenous vein harvested endoscopically and left internal mammary artery. (N/A) RADIAL ARTERY HARVEST (Left) TRANSESOPHAGEAL ECHOCARDIOGRAM (TEE) (N/A)  1. CV- Remains ST this morning with rate of low100s.  BP starting to climb. We will continue to titrate metoprolol to 50 mg twice daily.  Electrolyte stable this morning.   No further episodes of atrial fibrillation. 2. Pulm-Tolerating room air with excellent saturation.  3. Renal-creatinine 1.08, electrolytes okay. 4. H and H stable 10.8/31.2, expected acute blood loss anemia 5. Endo-continue insulin pump. Blood glucose variable.  6. Continue plavix and ASA 81. 7. GI- nausea has resolved but still not eating much due to the food.  Continue Glucerna for supplementation. 8.  Insomnia-we will add as needed Benadryl  Plan: Can remove epicardial pacing wires.  Rhythm has been stable.  Increase metoprolol for better heart rate and blood pressure control.  We will add back lisinopril before discharge.  Home tomorrow if he continues to progress.    LOS: 5 days    Sharlene Dory 08/29/2019 Patient seen and examined, plan as outlined above Home in AM if no further A fib  Vermelle Cammarata C. Dorris Fetch, MD [facility] [phone]

## 2019-08-29 NOTE — Progress Notes (Signed)
Pt ambulated 800 feet without any assistive device. Tolerated well. HR went up to 118, Sinus tach , came down to 90-100's as pt got back in bed. Will continue to monitor.

## 2019-08-29 NOTE — Progress Notes (Signed)
EPWs pulled per protocol.  Pt tolerated well, will continue to monitor.

## 2019-08-29 NOTE — Progress Notes (Signed)
Pt temp 101 pt denies any issues, pt ambulated around unit x 470 feet independently, pt used IS achieving 2000 x 12, repeat temp 99.2 will continue to monitor

## 2019-08-30 LAB — BASIC METABOLIC PANEL
Anion gap: 11 (ref 5–15)
BUN: 15 mg/dL (ref 6–20)
CO2: 24 mmol/L (ref 22–32)
Calcium: 8.6 mg/dL — ABNORMAL LOW (ref 8.9–10.3)
Chloride: 103 mmol/L (ref 98–111)
Creatinine, Ser: 0.94 mg/dL (ref 0.61–1.24)
GFR calc Af Amer: >60 mL/min (ref >60)
GFR calc non Af Amer: >60 mL/min (ref >60)
Glucose, Bld: 135 mg/dL — ABNORMAL HIGH (ref 70–99)
Potassium: 3.6 mmol/L (ref 3.5–5.1)
Sodium: 138 mmol/L (ref 135–145)

## 2019-08-30 LAB — CBC
HCT: 32.5 % — ABNORMAL LOW (ref 39.0–52.0)
Hemoglobin: 11.3 g/dL — ABNORMAL LOW (ref 13.0–17.0)
MCH: 31.2 pg (ref 26.0–34.0)
MCHC: 34.8 g/dL (ref 30.0–36.0)
MCV: 89.8 fL (ref 80.0–100.0)
Platelets: 278 10*3/uL (ref 150–400)
RBC: 3.62 MIL/uL — ABNORMAL LOW (ref 4.22–5.81)
RDW: 12.1 % (ref 11.5–15.5)
WBC: 8.7 10*3/uL (ref 4.0–10.5)
nRBC: 0 % (ref 0.0–0.2)

## 2019-08-30 LAB — GLUCOSE, CAPILLARY
Glucose-Capillary: 140 mg/dL — ABNORMAL HIGH (ref 70–99)
Glucose-Capillary: 142 mg/dL — ABNORMAL HIGH (ref 70–99)

## 2019-08-30 MED ORDER — METOPROLOL TARTRATE 50 MG PO TABS: 50.0000 mg | ORAL_TABLET | Freq: Two times a day (BID) | ORAL | 1 refills | Status: DC

## 2019-08-30 MED ORDER — ISOSORBIDE MONONITRATE ER 30 MG PO TB24: 30.0000 mg | ORAL_TABLET | Freq: Every day | ORAL | 0 refills | Status: DC

## 2019-08-30 MED ORDER — CLOPIDOGREL BISULFATE 75 MG PO TABS: 75.0000 mg | ORAL_TABLET | Freq: Every day | ORAL | 1 refills | Status: DC

## 2019-08-30 MED ORDER — TRAMADOL HCL 50 MG PO TABS: 50.0000 mg | ORAL_TABLET | Freq: Four times a day (QID) | ORAL | 0 refills | Status: AC | PRN

## 2019-08-30 MED ORDER — LISINOPRIL 10 MG PO TABS
20.0000 mg | ORAL_TABLET | Freq: Every day | ORAL | Status: DC
  Administered 2019-08-30: 08:00:00 20 mg via ORAL
  Filled 2019-08-30: qty 2

## 2019-08-30 MED ORDER — LISINOPRIL 40 MG PO TABS: 40.0000 mg | ORAL_TABLET | Freq: Every day | ORAL | Status: DC

## 2019-08-30 NOTE — Progress Notes (Addendum)
[address]       [place] [address]             [phone]      6 Days Post-Op Procedure(s) (LRB): CORONARY ARTERY BYPASS GRAFTING (CABG) times three, using left radial atery harvested endoscopically and right greater saphenous vein harvested endoscopically and left internal mammary artery. (N/A) RADIAL ARTERY HARVEST (Left) TRANSESOPHAGEAL ECHOCARDIOGRAM (TEE) (N/A) Subjective: Feels pretty well, some minor tachy at times, no further afib noted  Objective: Vital signs in last 24 hours: Temp:  [97.9 F (36.6 C)-101 F (38.3 C)] 97.9 F (36.6 C) (02/22 0423) Pulse Rate:  [87-108] 108 (02/22 0423) Cardiac Rhythm: Normal sinus rhythm;Sinus tachycardia (02/22 0423) Resp:  [17-24] 24 (02/22 0644) BP: (130-160)/(66-87) 157/79 (02/22 0423) SpO2:  [94 %-96 %] 95 % (02/22 0423) Weight:  [77.7 kg] 77.7 kg (02/22 0644)  Hemodynamic parameters for last 24 hours:    Intake/Output from previous day: 02/21 0701 - 02/22 0700 In: 600 [P.O.:600] Out: 800 [Urine:800] Intake/Output this shift: No intake/output data recorded.  General appearance: alert, cooperative and no distress Heart: regular rate and rhythm Lungs: clear to auscultation bilaterally Abdomen: benign Extremities: no edema, left hand N/V intact Wound: incis healing well  Lab Results: Recent Labs    08/29/19 0259 08/30/19 0111  WBC 8.1 8.7  HGB 10.8* 11.3*  HCT 31.4* 32.5*  PLT 246 278   BMET:  Recent Labs    08/29/19 0259 08/30/19 0111  NA 141 138  K 3.9 3.6  CL 105 103  CO2 24 24  GLUCOSE 155* 135*  BUN 16 15  CREATININE 1.01 0.94  CALCIUM 8.2* 8.6*    PT/INR: No results for input(s): LABPROT, INR in the last 72 hours. ABG    Component Value Date/Time   PHART 7.333 (L) 08/24/2019 1921   HCO3 23.4 08/24/2019 1921   TCO2 25 08/24/2019 1921   ACIDBASEDEF 2.0 08/24/2019 1921   O2SAT 99.0 08/24/2019 1921   CBG (last 3)  Recent Labs    08/29/19 2002 08/29/19 2349  08/30/19 0418  GLUCAP 195* 143* 140*    Meds Scheduled Meds: . aspirin EC  81 mg Oral Daily  . bisacodyl  10 mg Oral Daily   Or  . bisacodyl  10 mg Rectal Daily  . Chlorhexidine Gluconate Cloth  6 each Topical Daily  . clopidogrel  75 mg Oral Daily  . docusate sodium  200 mg Oral Daily  . enoxaparin (LOVENOX) injection  40 mg Subcutaneous Q24H  . feeding supplement (GLUCERNA SHAKE)  237 mL Oral TID BM  . insulin pump   Subcutaneous Q4H  . isosorbide mononitrate  30 mg Oral Daily  . latanoprost  1 drop Both Eyes QHS  . lisinopril  20 mg Oral Daily  . mouth rinse  15 mL Mouth Rinse BID  . metoprolol tartrate  50 mg Oral BID  . pantoprazole  40 mg Oral Daily  . rosuvastatin  20 mg Oral QHS  . sodium chloride flush  3 mL Intravenous Q12H  . timolol  1 drop Both Eyes Daily   Continuous Infusions: . ondansetron (ZOFRAN) IV Stopped (08/25/19 1125)   PRN Meds:.diphenhydrAMINE, labetalol, metoprolol tartrate, ondansetron (ZOFRAN) IV, promethazine, sodium chloride flush, traMADol, zolpidem  Xrays DG CHEST PORT 1 VIEW  Result Date: 08/28/2019 CLINICAL DATA:  CABG. EXAM: PORTABLE CHEST 1 VIEW COMPARISON:  08/27/2019 FINDINGS: Previous median sternotomy. Lungs are adequately inflated without focal airspace consolidation or effusion. No  pneumothorax. Borderline stable cardiomegaly. Remainder of the exam is unchanged. IMPRESSION: No acute cardiopulmonary disease. Electronically Signed   By: Elberta Fortis M.D.   On: 08/28/2019 09:26    Assessment/Plan: S/P Procedure(s) (LRB): CORONARY ARTERY BYPASS GRAFTING (CABG) times three, using left radial atery harvested endoscopically and right greater saphenous vein harvested endoscopically and left internal mammary artery. (N/A) RADIAL ARTERY HARVEST (Left) TRANSESOPHAGEAL ECHOCARDIOGRAM (TEE) (N/A)  1 doing well overall 2 some HTN, tachy at times, cont imdur and metoprolol at current dose. Will resume hydodiuril 3 sats good on RA 4 labs  stable 5 sugars variable- he will adjust insulin pump at home 6 stable for d/c  LOS: 6 days    Rowe Clack [facility] 08/30/2019 Pager [phone]  DC instructions reviewed with patient patient examined and medical record reviewed,agree with above note. Kathlee Nations Trigt III 08/30/2019

## 2019-08-30 NOTE — Progress Notes (Signed)
[id number] Came to see pt to see if any questions re ed done on Friday. Referring to [facility] CRP 2. Pt has been walking independently. Has Transport planner. Ready for d/c. Luetta Nutting RN BSN 08/30/2019 9:18 AM

## 2019-08-30 NOTE — Research (Addendum)
[facility] Research Study  Visit 7  Visit 7 labs and urine collected.        ABNORMAL LABS   [x]    No change or change not clinically significant. NO FOLLOW UP REQUIRED. []    Change clinically significant and attributable to disease or management. NO FOLLOW UP  REQUIRED. []    Change clinically significant and possibly attributable to study medication. NO FOLLOW UP REQUIRED. []    Change clinically significant and attributable to study medication. FOLLOW UP REQUIRED. []    Apparent lab error. []    Unevaluable.

## 2019-08-30 NOTE — Progress Notes (Signed)
Discharge paperwork reviewed with patient including medication changes and additions.  IV removed.  Chest tube sutures removed per order.  CCMD notified.  Patient assisted to private vehicle by staff.

## 2019-08-30 NOTE — Plan of Care (Signed)

## 2019-08-30 NOTE — Research (Addendum)
[facility] Research Study  Day of Discharge  DOD labs and urine collected.  [id number]  MOBILITY:    I HAVE NO PROBLEMS WALKING [x]   I HAVE SLIGHT PROBLEMS WALKING []   I HAVE MODERATE PROBLEMS WALKING []   I HAVE SEVERE PROBLEMS WALKING []   I AM UNABLE TO WALK  []     SELF-CARE:   I HAVE NO PROBLEMS WASHING OR DRESSING MYSELF  [x]   I HAVE SLIGHT PROBLEMS WASHING OR DRESSING MYSELF  []   I HAVE MODERATE PROBLEMS WASHING OR DRESSING MYSELF []   I HAVE SEVERE PROBLEMS WASHING OR DRESSING MYSELF  []   I HAVE SEVERE PROBLEMS WASHING OR DRESSING MYSELF  []   I AM UNABLE TO WASH OR DRESS MYSELF []     USUAL ACTIVITIES: (E.G. WORK/STUDY/HOUSEWORK/FAMILY OR LEISURE ACTIVITIES.    I HAVE NO PROBLEMS DOING MY USUAL ACTIVITIES [x]   I HAVE SLIGHT PROBLEMS DOING MY USUAL ACTIVITIES []   I HAVE MODERATE PROBLEMS DOING MY USUAL ACTIVIITIES []   I HAVE SEVERE PROBLEMS DOING MY USUAL ACTIVITIES []   I AM UNABLE TO DO MY USUAL ACTIVITIES []     PAIN /DISCOMFORT   I HAVE NO PAIN OR DISCOMFORT []   I HAVE SLIGHT PAIN OR DISCOMFORT [x]   I HAVE MODERATE PAIN OR DISCOMFORT []   I HAVE SEVERE PAIN OR DISCOMFORT []   I HAVE EXTREME PAIN OR DISCOMFORT []     ANXIETY/DEPRESSION   I AM NOT ANXIOUS OR DEPRESSED [x]   I AM SLIGHTLY ANXIOUS OR DEPRESSED []   I AM MODERATELY ANXIOUS OR DREPRESSED []   I AM SEVERELY ANXIOUS OR DEPRESSED []   I AM EXTREMELY ANXIOUS OR DEPRESSED []     SCALE OF 0-100 HOW WOULD YOU RATE TODAY?  0 IS THE WORSE AND 100 IS THE BEST HEALTH YOU CAN IMAGINE: 80         ABNORMAL LABS   []    No change or change not clinically significant. NO FOLLOW UP REQUIRED. [x]    Change clinically significant and attributable to disease or management. NO FOLLOW UP  REQUIRED. []    Change clinically significant and possibly attributable to study medication. NO FOLLOW UP REQUIRED. []    Change clinically significant and attributable to study medication. FOLLOW UP REQUIRED. []    Apparent lab error. []     Unevaluable.

## 2019-08-31 NOTE — Telephone Encounter (Signed)
Patient reschedule with you for the 9th, he wanted to wait since just getting out of hospital. Thank you!

## 2019-08-31 NOTE — Telephone Encounter (Signed)
Called patient to go over his TOC call-  Patient would like to know since he was just discharged yesterday if the appointment was too soon- I advised I would check with NP.

## 2019-09-01 ENCOUNTER — Ambulatory Visit: Payer: BC Managed Care – PPO | Admitting: General Practice

## 2019-09-01 ENCOUNTER — Ambulatory Visit: Payer: BC Managed Care – PPO | Admitting: Cardiovascular Disease

## 2019-09-07 ENCOUNTER — Telehealth: Payer: Self-pay

## 2019-09-07 DIAGNOSIS — Z951 Presence of aortocoronary bypass graft: Secondary | ICD-10-CM

## 2019-09-07 MED ORDER — METOPROLOL TARTRATE 25 MG PO TABS: 25.0000 mg | ORAL_TABLET | Freq: Two times a day (BID) | ORAL | 1 refills | Status: DC

## 2019-09-07 NOTE — Telephone Encounter (Signed)
Pt called office with c/o being lightheaded, particularly when standing/walking. States his BP while sitting was 120/80, and after standing it was 105/60. Reports heart rate has been running in the mid 80's (and higher before taking Lopressor).  He wants to know if his BP meds need to be adjusted. He hasn't seen a cardiologist yet; says he has an appt next week. Discussed situation w/ D. Joycelyn Man, [place]. New orders to d/c Lisinopril, decrease Lopressor to 25 mg bid, and advise pt to stay hydrated. Pt notified of the new orders and verbalizes understanding. Instructed to call [facility] if symptoms do not improve and prn other problems/concerns.

## 2019-09-07 NOTE — Addendum Note (Signed)
Addended by: Demetria Pore on: 09/07/2019 03:42 PM   Modules accepted: Orders

## 2019-09-10 ENCOUNTER — Other Ambulatory Visit: Payer: Self-pay | Admitting: Cardiothoracic Surgery

## 2019-09-10 DIAGNOSIS — Z951 Presence of aortocoronary bypass graft: Secondary | ICD-10-CM

## 2019-09-13 DIAGNOSIS — Z48812 Encounter for surgical aftercare following surgery on the circulatory system: Secondary | ICD-10-CM | POA: Diagnosis not present

## 2019-09-13 DIAGNOSIS — Z951 Presence of aortocoronary bypass graft: Secondary | ICD-10-CM | POA: Diagnosis not present

## 2019-09-13 NOTE — Progress Notes (Signed)
Cardiology Clinic Note   Patient Name: Harry Moore Date of Encounter: 09/14/2019  Primary Care Provider:  [name], MD Primary Cardiologist:  [name], MD  Patient Profile    Dr. Vania Rea. Rodier 59 year old male presents today for follow-up of his coronary artery disease status post CABG x3 (08/24/19), essential hypertension, and hyperlipidemia.  Past Medical History    Past Medical History:  Diagnosis Date  . Coronary artery disease   . Diabetes mellitus without complication (HCC)   . Hyperlipidemia   . Hypertension   . Neuromuscular disorder (HCC)    neuropathy in feet  . Pneumonia    Past Surgical History:  Procedure Laterality Date  . CLEFT PALATE REPAIR    . CORONARY ARTERY BYPASS GRAFT N/A 08/24/2019   Procedure: CORONARY ARTERY BYPASS GRAFTING (CABG) times three, using left radial atery harvested endoscopically and right greater saphenous vein harvested endoscopically and left internal mammary artery.;  Surgeon: Kerin Perna, MD;  Location: [facility] OR;  Service: Open Heart Surgery;  Laterality: N/A;  swan only  . LEFT HEART CATH AND CORONARY ANGIOGRAPHY N/A 08/12/2019   Procedure: LEFT HEART CATH AND CORONARY ANGIOGRAPHY;  Surgeon: [name], MD;  Location: [facility];  Service: Cardiovascular;  Laterality: N/A;  . RADIAL ARTERY HARVEST Left 08/24/2019   Procedure: RADIAL ARTERY HARVEST;  Surgeon: Kerin Perna, MD;  Location: [facility] OR;  Service: Open Heart Surgery;  Laterality: Left;  . TEE WITHOUT CARDIOVERSION N/A 08/24/2019   Procedure: TRANSESOPHAGEAL ECHOCARDIOGRAM (TEE);  Surgeon: Donata Clay, Theron Arista, MD;  Location: [facility] OR;  Service: Open Heart Surgery;  Laterality: N/A;    Allergies  Allergies  Allergen Reactions  . Penicillins Rash    Did it involve swelling of the face/tongue/throat, SOB, or low BP? No Did it involve sudden or severe rash/hives, skin peeling, or any reaction on the inside of your mouth or nose?    #  #  YES  #  #  Did you  need to seek medical attention at a hospital or doctor's office? No When did it last happen?childhood reaction.   . 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 [name] that showed stenosis of proximal LAD and OM1, RCA with no significant stenosis.  Normal LVEDP.  His PMH also includes essential hypertension, hyperlipidemia, chest pain of uncertain etiology, and family history of heart disease.  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  He was referred by Dr. [name] for cardiovascular evaluation due to chest discomfort.  He was seen in the office by Dr. [name] 06/01/2019.  He is from [facility].  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  He then retired 4 years ago.  He has had known type 1 diabetes since the age of 59 and now uses an insulin pump.  His father had coronary artery disease and had CABG at age 88.  He was noted to have had chest pain for a year which was exertional in nature but occurred randomly with different activities.  He was seen in the emergency room in [place] and ruled out for MI.  He had a coronary CTA on 07/27/2019 which showed three-vessel CAD, moderate  in circumflex and LAD.  He underwent cardiac catheterization 08/12/2019 which showed moderate left main disease as well as proximal and mid LAD and segmental proximal circumflex within normal left ventricular function.  He was then referred to CVTS for further evaluation.  He underwent CABG x3 08/24/2019.  He contacted cardiac surgery triage on 09/07/2019 and indicated that he had been lightheaded particularly when standing and walking.  His blood pressure at that time was reported at 120/80 and after standing it was 105/60.  His lisinopril was discontinued,  his Lopressor was decreased to 25 mg twice daily and he was encouraged to increase his p.o. hydration.  He presents to the clinic today for follow-up evaluation and states he feels well.  He has been walking up and down his driveway,  started cardiac rehab yesterday.  They have started him with 6-minute walks and he has been tolerating it well.  He is compliant with sternal precautions.  Left radial, sternal and chest tube sites clean dry intact open to air.  No drainage.  He contacted cardiac surgery 09/07/2019 with symptoms of lightheadedness and has stopped his lisinopril.  He continues with metoprolol tartrate 50 mg twice daily.  Has had some asymptomatic tachycardia in the a.m. and wishes to continue 50 mg dosing.  Has taken rosuvastatin 20 mg for the past 6 months LDL at 83.  I will increase his rosuvastatin to 40 mg daily and reevaluate lipid panel and LFTs in 3 months.  Follow-up with Dr. [name] in 3 months.  Today he denies chest pain, shortness of breath, lower extremity edema, fatigue, palpitations,  weakness, presyncope, and syncope.    Home Medications    Prior to Admission medications   Medication Sig Start Date End Date Taking? Authorizing Provider  aspirin EC 81 MG tablet Take 81 mg by mouth at bedtime.    [provider]  clopidogrel (PLAVIX) 75 MG tablet Take 1 tablet (75 mg total) by mouth daily. 08/30/19   Gold, Deniece Portela E, PA-C  docusate sodium (COLACE) 100 MG capsule Take 100 mg by mouth at bedtime.    [provider]  hydrochlorothiazide (HYDRODIURIL) 25 MG tablet Take 12.5 mg by mouth at bedtime.    [provider]  insulin aspart (NOVOLOG) 100 UNIT/ML injection Inject into the skin. Via Insulin PUMP    [provider]  Insulin Human (INSULIN PUMP) SOLN Inject into the skin. insulin aspart (NOVOLOG) 100 UNIT/ML injection    [provider]  isosorbide mononitrate (IMDUR) 30 MG 24 hr tablet Take 1 tablet (30 mg total) by mouth daily.  08/30/19   Gold, Wayne E, PA-C  latanoprost (XALATAN) 0.005 % ophthalmic solution Place 1 drop into both eyes at bedtime.     [provider]  metoprolol tartrate (LOPRESSOR) 25 MG tablet TAKE 0.5 TABLETS (12.5 MG TOTAL) BY MOUTH 2 (TWO) TIMES DAILY. 09/10/19   Kerin Perna, MD  rosuvastatin (CRESTOR) 20 MG tablet Take 20 mg by mouth at bedtime.     [provider]  timolol (TIMOPTIC) 0.5 % ophthalmic solution Place 1 drop into both eyes daily.    [provider]    Family History    No family history on file. has no family status information on file.   Social History    Social History   Socioeconomic History  . Marital status: Single    Spouse name: Not on file  . Number of children: Not on file  . Years of education: Not on file  .  Highest education level: Not on file  Occupational History  . Not on file  Tobacco Use  . Smoking status: Never Smoker  . Smokeless tobacco: Never Used  Substance and Sexual Activity  . Alcohol use: Yes    Comment: 3-4 oz/week vodka liquor  . Drug use: Never  . Sexual activity: Not on file  Other Topics Concern  . Not on file  Social History Narrative  . Not on file   Social Determinants of Health   Financial Resource Strain:   . Difficulty of Paying Living Expenses: Not on file  Food Insecurity:   . Worried About Programme researcher, broadcasting/film/video in the Last Year: Not on file  . Ran Out of Food in the Last Year: Not on file  Transportation Needs:   . Lack of Transportation (Medical): Not on file  . Lack of Transportation (Non-Medical): Not on file  Physical Activity:   . Days of Exercise per Week: Not on file  . Minutes of Exercise per Session: Not on file  Stress:   . Feeling of Stress : Not on file  Social Connections:   . Frequency of Communication with Friends and Family: Not on file  . Frequency of Social Gatherings with Friends and Family: Not on file  . Attends Religious Services: Not on file  . Active Member  of Clubs or Organizations: Not on file  . Attends Banker Meetings: Not on file  . Marital Status: Not on file  Intimate Partner Violence:   . Fear of Current or Ex-Partner: Not on file  . Emotionally Abused: Not on file  . Physically Abused: Not on file  . Sexually Abused: Not on file     Review of Systems    General:  No chills, fever, night sweats or weight changes.  Cardiovascular:  No chest pain, dyspnea on exertion, edema, orthopnea, palpitations, paroxysmal nocturnal dyspnea. Dermatological: No rash, lesions/masses Respiratory: No cough, dyspnea Urologic: No hematuria, dysuria Abdominal:   No nausea, vomiting, diarrhea, bright red blood per rectum, melena, or hematemesis Neurologic:  No visual changes, wkns, changes in mental status. All other systems reviewed and are otherwise negative except as noted above.  Physical Exam    VS:  BP 136/70   Pulse 89   Temp (!) 94.3 F (34.6 C)   Ht 5\' 10"  (1.778 m)   Wt 169 lb 12.8 oz (77 kg)   SpO2 90%   BMI 24.36 kg/m  , BMI Body mass index is 24.36 kg/m. GEN: Well nourished, well developed, in no acute distress. HEENT: normal. Neck: Supple, no JVD, carotid bruits, or masses. Cardiac: RRR, no murmurs, rubs, or gallops. No clubbing, cyanosis, edema.  Radials/DP/PT 2+ and equal bilaterally.  Respiratory:  Respirations regular and unlabored, clear to auscultation bilaterally. GI: Soft, nontender, nondistended, BS + x 4. MS: no deformity or atrophy. Skin: warm and dry, no rash. Neuro:  Strength and sensation are intact. Psych: Normal affect.  Accessory Clinical Findings    ECG personally reviewed by me today-sinus rhythm nonspecific T wave abnormality 83 bpm  EKG 08/28/2019 Normal sinus rhythm 93 bpm  Coronary CTA 07/27/2019  Coronary calcium score: The patient's coronary artery calcium score is 647, which places the patient in the 99 percentile. Three vessel distribution of coronary calcium.  Coronary  arteries: Normal coronary origins.  Right dominance.  Right Coronary Artery: Normal caliber vessel, gives rise to PDA. Diffuse proximal mixed calcified and noncalcified plaque with 25-49% stenosis. In mid to distal  portion, there is mixed calcified and noncalcified plaque with 1-24% stenosis.  Left Main Coronary Artery: Normal caliber vessel. Small area of calcified plaque with 1-24% stenosis.  Left Anterior Descending Coronary Artery: Normal caliber vessel. Scattered mixed calcified and noncalcified plaque, most prominent in proximal portion of LAD. There appears to be an area of predominantly noncalcified plaque with 50-69% stenosis proximally. Gives rise to 1 main diagonal branch, which proximally has mixed calcified and noncalcified plaque, unable to visually assess stenosis. Distal LAD wraps apex.  Left Circumflex Artery: Normal caliber vessel. Scattered mixed calcified and noncalcified plaque most prominent in proximal portion. Focal area of stenosis within calcified plaque appears to have 50-69% stenosis, but higher degree of stenosis cannot be excluded. Gives rise to 1 main OM branch.  Aorta: Normal size, 34 mm at the mid ascending aorta (level of the PA bifurcation) measured double oblique. No calcifications. No dissection.  Aortic Valve: No calcifications. Trileaflet.  FINDINGS: FFRct analysis was performed on the original cardiac CT angiogram dataset. Diagrammatic representation of the FFRct analysis is provided in a separate PDF document in PACS. This dictation was created using the PDF document and an interactive 3D model of the results. 3D model is not available in the EMR/PACS. Normal FFR range is >0.80.  1. Left Main:  No significant stenosis. FFR = 0.96  2. LAD: Focal stenosis in proximal LAD, with FFR 0.73. Second focal stenosis in mid LAD, FFR is 0.57 (from vessel origin). 3. LCX: There is a focal proximal stenosis with FFR 0.76. Mid and distal  LCX could not be evaluated due to technical limitations. 4. RCA: No significant stenosis. Proximal FFR = 0.95, Mid FFR = 0.89, Distal FFR = 0.85. FFR at terminal RCA is 0.72 but this is a very small caliber section of vessel.  IMPRESSION: 1. CT FFR shows significant stenosis in proximal LAD (0.73), mid LAD (0.57 from origin), and proximal LCX (0.76).   Cardiac catheterization 08/12/2019  Ost LAD to Prox LAD lesion is 90% stenosed.  Mid LAD-1 lesion is 50% stenosed.  Mid LAD-2 lesion is 90% stenosed.  Ost Cx to Mid Cx lesion is 90% stenosed.  Ost LM to Dist LM lesion is 50% stenosed.  Prox RCA lesion is 30% stenosed.  Prox RCA to Mid RCA lesion is 30% stenosed.  The left ventricular systolic function is normal.  LV end diastolic pressure is normal.  The left ventricular ejection fraction is 55-65% by visual estimate.    Coronary Diagrams  Diagnostic Dominance: Right  Intervention    Assessment & Plan   1. Status post CABG-3-vessel 08/24/2019 (LIMA to LAD, left radial artery to OM1, SVG to diagonal).He underwent cardiac catheterization 08/12/2019 which showed moderate left main disease as well as proximal and mid LAD and segmental proximal circumflex within normal left ventricular function. Continue aspirin 81 mg tablet daily Continue clopidogrel 75 mg tablet daily Continue hydrochlorothiazide 12.5 mg daily Continue isosorbide mononitrate 30 mg daily Continue metoprolol tartrate 50 mg twice daily Continue rosuvastatin 40 mg at bedtime Heart healthy low-sodium diet Continue cardiopulmonary rehab-sternal precautions  Essential hypertension-BP today 136/70 Continue hydrochlorothiazide 12.5 mg daily Continue isosorbide mononitrate 30 mg daily Continue metoprolol tartrate 50 mg twice daily Heart healthy low-sodium diet-salty 6 given  Hyperlipidemia-08/05/2019: Cholesterol, Total 139; HDL 41; LDL Chol Calc (NIH) 83; Triglycerides 76 Increase rosuvastatin 40 mg at  bedtime Heart healthy low-sodium high-fiber diet Repeat lipid panel and LFTs in 3 months.  Disposition: Follow-up with Dr. Gwenlyn Found in 3 months.  Thomasene Ripple. Jalayiah Bibian NP-C        [facility] [address] Office [phone] Fax [phone]

## 2019-09-14 ENCOUNTER — Other Ambulatory Visit: Payer: Self-pay

## 2019-09-14 ENCOUNTER — Encounter: Payer: Self-pay | Admitting: General Practice

## 2019-09-14 ENCOUNTER — Ambulatory Visit: Payer: BC Managed Care – PPO | Admitting: General Practice

## 2019-09-14 VITALS — BP 136/70 | HR 89 | Temp 94.3°F | Ht 70.0 in | Wt 169.8 lb

## 2019-09-14 DIAGNOSIS — I10 Essential (primary) hypertension: Secondary | ICD-10-CM

## 2019-09-14 DIAGNOSIS — Z951 Presence of aortocoronary bypass graft: Secondary | ICD-10-CM

## 2019-09-14 DIAGNOSIS — E782 Mixed hyperlipidemia: Secondary | ICD-10-CM

## 2019-09-14 DIAGNOSIS — Z79899 Other long term (current) drug therapy: Secondary | ICD-10-CM | POA: Diagnosis not present

## 2019-09-14 MED ORDER — ROSUVASTATIN CALCIUM 40 MG PO TABS: 40.0000 mg | ORAL_TABLET | Freq: Every day | ORAL | 4 refills | Status: DC

## 2019-09-14 NOTE — Patient Instructions (Signed)
Medication Instructions:  INCREASE CRESTOR 40MG  IN THE EVENING  If you need a refill on your cardiac medications before your next appointment, please call your pharmacy.  LAB FASTING LIPID AND LFT 3 DAYS BEFORE FOLLOW UP APPOINTMENT HERE IN OUR OFFICE AT [facility]  If you have labs (blood work) drawn today and your tests are completely normal, you will receive your results only by: [facility] MyChart Message (if you have MyChart) OR A paper copy in the mail If you have any lab test that is abnormal or we need to change your treatment, we will call you to review these results. You may go to any [facility] lab that is convenient for you however, we do have a lab in our office that is able to assist you. You do NOT need an appointment for our lab. Once in our office in our office lobby there is a podium where you sign-in and ring the doorbell to alert Marland Kitchen that you are here. Lab is open 8:00am and closes at 4:00pm; closes for lunch from 12:45 - 1:45pm. PLEASE BRING A COPY OF YOUR INSURANCE CARD WITH YOU.  Special Instructions: PLEASE READ AND FOLLOW SALTY 6 ATTACHED  Follow-Up: 3 months  In Person [place], MD.    At [facility], you and your health needs are our priority.  As part of our continuing mission to provide you with exceptional heart care, we have created designated Provider Care Teams.  These Care Teams include your primary Cardiologist (physician) and Advanced Practice Providers (APPs -  Physician Assistants and Nurse Practitioners) who all work together to provide you with the care you need, when you need it.  Reduce your risk of getting COVID-19 With your heart disease it is especially important for people at increased risk of severe illness from COVID-19, and those who live with them, to protect themselves from getting COVID-19. The best way to protect yourself and to help reduce the spread of the virus that causes COVID-19 is to: [facility] Limit your interactions with other people as much as  possible. . Take COVID-19 when you do interact with others. If you start feeling sick and think you may have COVID-19, get in touch with your healthcare provider within 24 hours.  Thank you for choosing [facility] at [facility]!!

## 2019-09-16 DIAGNOSIS — Z48812 Encounter for surgical aftercare following surgery on the circulatory system: Secondary | ICD-10-CM | POA: Diagnosis not present

## 2019-09-16 DIAGNOSIS — Z951 Presence of aortocoronary bypass graft: Secondary | ICD-10-CM | POA: Diagnosis not present

## 2019-09-17 DIAGNOSIS — Z48812 Encounter for surgical aftercare following surgery on the circulatory system: Secondary | ICD-10-CM | POA: Diagnosis not present

## 2019-09-17 DIAGNOSIS — Z951 Presence of aortocoronary bypass graft: Secondary | ICD-10-CM | POA: Diagnosis not present

## 2019-09-21 ENCOUNTER — Other Ambulatory Visit: Payer: Self-pay | Admitting: Cardiothoracic Surgery

## 2019-09-21 DIAGNOSIS — Z951 Presence of aortocoronary bypass graft: Secondary | ICD-10-CM

## 2019-09-21 DIAGNOSIS — Z48812 Encounter for surgical aftercare following surgery on the circulatory system: Secondary | ICD-10-CM | POA: Diagnosis not present

## 2019-09-22 ENCOUNTER — Other Ambulatory Visit: Payer: Self-pay

## 2019-09-22 ENCOUNTER — Encounter: Payer: Self-pay | Admitting: Cardiothoracic Surgery

## 2019-09-22 ENCOUNTER — Ambulatory Visit (INDEPENDENT_AMBULATORY_CARE_PROVIDER_SITE_OTHER): Payer: Self-pay | Admitting: Cardiothoracic Surgery

## 2019-09-22 ENCOUNTER — Ambulatory Visit
Admission: RE | Admit: 2019-09-22 | Discharge: 2019-09-22 | Disposition: A | Discharge status: Home / Self Care | Payer: BC Managed Care – PPO | Source: Ambulatory Visit | Attending: Cardiothoracic Surgery | Admitting: Cardiothoracic Surgery

## 2019-09-22 VITALS — BP 116/73 | HR 77 | Temp 98.8°F | Wt 167.6 lb

## 2019-09-22 VITALS — BP 140/80 | HR 76 | Temp 97.7°F | Resp 20 | Ht 70.0 in | Wt 169.0 lb

## 2019-09-22 DIAGNOSIS — Z951 Presence of aortocoronary bypass graft: Secondary | ICD-10-CM

## 2019-09-22 DIAGNOSIS — Z006 Encounter for examination for normal comparison and control in clinical research program: Secondary | ICD-10-CM

## 2019-09-22 DIAGNOSIS — Z09 Encounter for follow-up examination after completed treatment for conditions other than malignant neoplasm: Secondary | ICD-10-CM

## 2019-09-22 DIAGNOSIS — I2510 Atherosclerotic heart disease of native coronary artery without angina pectoris: Secondary | ICD-10-CM

## 2019-09-22 NOTE — Progress Notes (Signed)
PCP is Kathyrn Lass, MD Referring Provider is Lorretta Harp, MD  Chief Complaint  Patient presents with  . Routine Post Op    f/u from surgery with CXR s/p CABG    HPI: 1 month follow-up after scheduled CABG x3 with left IMA and left free radial artery and vein graft.  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 He has done well without complaints at this time.  He did have some orthostatic dizziness after first going home which was treated by reducing his beta-blocker and blood pressure medicine.  Incisions are healing well. Chest x-ray today is clear. He is anxious to start driving and increasing activities. He has started outpatient cardiac rehab at [facility] and is doing well.  We will stop his Imdur, continue Plavix for 3 months, and because of his increasing blood pressure he will resume half of his usual dose of lisinopril and monitor for dizziness.   Past Medical History:  Diagnosis Date  . Coronary artery disease   . Diabetes mellitus without complication ([place])   . Hyperlipidemia   . Hypertension   . Neuromuscular disorder (HCC)    neuropathy in feet  . Pneumonia     Past Surgical History:  Procedure Laterality Date  . CLEFT PALATE REPAIR    . CORONARY ARTERY BYPASS GRAFT N/A 08/24/2019   Procedure: CORONARY ARTERY BYPASS GRAFTING (CABG) times three, using left radial atery harvested endoscopically and right greater saphenous vein harvested endoscopically and left internal mammary artery.;  Surgeon: Ivin Poot, MD;  Location: [place];  Service: Open Heart Surgery;  Laterality: N/A;  swan only  . LEFT HEART CATH AND CORONARY ANGIOGRAPHY N/A 08/12/2019   Procedure: LEFT HEART CATH AND CORONARY ANGIOGRAPHY;  Surgeon: Lorretta Harp, MD;  Location: [place] CV LAB;  Service: Cardiovascular;  Laterality: N/A;  . RADIAL ARTERY HARVEST Left 08/24/2019   Procedure: RADIAL ARTERY HARVEST;  Surgeon: Ivin Poot, MD;  Location: [place];  Service: Open Heart  Surgery;  Laterality: Left;  . TEE WITHOUT CARDIOVERSION N/A 08/24/2019   Procedure: TRANSESOPHAGEAL ECHOCARDIOGRAM (TEE);  Surgeon: Prescott Gum, Collier Salina, MD;  Location: [place];  Service: Open Heart Surgery;  Laterality: N/A;    History reviewed. No pertinent family history.  Social History Social History   Tobacco Use  . Smoking status: Never Smoker  . Smokeless tobacco: Never Used  Substance Use Topics  . Alcohol use: Yes    Comment: 3-4 oz/week vodka liquor  . Drug use: Never    Current Outpatient Medications  Medication Sig Dispense Refill  . aspirin EC 81 MG tablet Take 81 mg by mouth at bedtime.    . clopidogrel (PLAVIX) 75 MG tablet Take 1 tablet (75 mg total) by mouth daily. 30 tablet 1  . docusate sodium (COLACE) 100 MG capsule Take 100 mg by mouth at bedtime.    . hydrochlorothiazide (HYDRODIURIL) 25 MG tablet Take 12.5 mg by mouth at bedtime.    . insulin aspart (NOVOLOG) 100 UNIT/ML injection Inject into the skin. Via Insulin PUMP    . Insulin Human (INSULIN PUMP) SOLN Inject into the skin. insulin aspart (NOVOLOG) 100 UNIT/ML injection    . isosorbide mononitrate (IMDUR) 30 MG 24 hr tablet Take 1 tablet (30 mg total) by mouth daily. 30 tablet 0  . latanoprost (XALATAN) 0.005 % ophthalmic solution Place 1 drop into both eyes at bedtime.     . metoprolol tartrate (LOPRESSOR) 25 MG tablet TAKE 0.5 TABLETS (12.5 MG TOTAL) BY  MOUTH 2 (TWO) TIMES DAILY. (Patient taking differently: 50 mg. ) 30 tablet 1  . rosuvastatin (CRESTOR) 40 MG tablet Take 1 tablet (40 mg total) by mouth at bedtime. 30 tablet 4  . timolol (TIMOPTIC) 0.5 % ophthalmic solution Place 1 drop into both eyes daily.     No current facility-administered medications for this visit.    Allergies  Allergen Reactions  . Penicillins Rash    Did it involve swelling of the face/tongue/throat, SOB, or low BP? No Did it involve sudden or severe rash/hives, skin peeling, or any reaction on the inside of your mouth or  nose?    #  #  YES  #  #  Did you need to seek medical attention at a hospital or doctor's office? No When did it last happen?childhood reaction.   . Latex Other (See Comments)    Contact dermatitis    Review of Systems   No complaints  BP 140/80   Pulse 76   Temp 97.7 F (36.5 C) (Skin)   Resp 20   Ht 5\' 10"  (1.778 m)   Wt 169 lb (76.7 kg)   SpO2 99% Comment: RA  BMI 24.25 kg/m  Physical Exam      Exam    General- alert and comfortable.  Surgical incision healing well.    Neck- no JVD, no cervical adenopathy palpable, no carotid bruit   Lungs- clear without rales, wheezes   Cor- regular rate and rhythm, no murmur , gallop   Abdomen- soft, non-tender   Extremities - warm, non-tender, minimal edema   Neuro- oriented, appropriate, no focal weakness   Diagnostic Tests: Chest x-ray clear  Impression: Patient is doing very well, 50 to 60% recovered. He understands he can resume driving lifting up to  pounds. We will stop his Imdur and resume lisinopril and check blood pressures. Plan: Plan see him back in 4 to 6 weeks for assessment of progress.   47-42, MD [facility] [phone]

## 2019-09-22 NOTE — Research (Addendum)
[facility]  Visit 9/30 Day  Visit 9 labs and urine collected.    Current Outpatient Medications:  .  aspirin EC 81 MG tablet, Take 81 mg by mouth at bedtime., Disp: , Rfl:  .  clopidogrel (PLAVIX) 75 MG tablet, Take 1 tablet (75 mg total) by mouth daily., Disp: 30 tablet, Rfl: 1 .  docusate sodium (COLACE) 100 MG capsule, Take 100 mg by mouth at bedtime., Disp: , Rfl:  .  hydrochlorothiazide (HYDRODIURIL) 25 MG tablet, Take 12.5 mg by mouth at bedtime., Disp: , Rfl:  .  insulin aspart (NOVOLOG) 100 UNIT/ML injection, Inject into the skin. Via Insulin PUMP, Disp: , Rfl:  .  Insulin Human (INSULIN PUMP) SOLN, Inject into the skin. insulin aspart (NOVOLOG) 100 UNIT/ML injection, Disp: , Rfl:  .  isosorbide mononitrate (IMDUR) 30 MG 24 hr tablet, Take 1 tablet (30 mg total) by mouth daily., Disp: 30 tablet, Rfl: 0 .  latanoprost (XALATAN) 0.005 % ophthalmic solution, Place 1 drop into both eyes at bedtime. , Disp: , Rfl:  .  metoprolol tartrate (LOPRESSOR) 25 MG tablet, TAKE 0.5 TABLETS (12.5 MG TOTAL) BY MOUTH 2 (TWO) TIMES DAILY. (Patient taking differently: 50 mg. ), Disp: 30 tablet, Rfl: 1 .  rosuvastatin (CRESTOR) 40 MG tablet, Take 1 tablet (40 mg total) by mouth at bedtime., Disp: 30 tablet, Rfl: 4 .  timolol (TIMOPTIC) 0.5 % ophthalmic solution, Place 1 drop into both eyes daily., Disp: , Rfl:  EQ-5D-5L  MOBILITY:    I HAVE NO PROBLEMS WALKING [x]   I HAVE SLIGHT PROBLEMS WALKING []   I HAVE MODERATE PROBLEMS WALKING []   I HAVE SEVERE PROBLEMS WALKING []   I AM UNABLE TO WALK  []     SELF-CARE:   I HAVE NO PROBLEMS WASHING OR DRESSING MYSELF  [x]   I HAVE SLIGHT PROBLEMS WASHING OR DRESSING MYSELF  []   I HAVE MODERATE PROBLEMS WASHING OR DRESSING MYSELF []   I HAVE SEVERE PROBLEMS WASHING OR DRESSING MYSELF  []   I HAVE SEVERE PROBLEMS WASHING OR DRESSING MYSELF  []   I AM UNABLE TO WASH OR DRESS MYSELF []     USUAL ACTIVITIES: (E.G.  WORK/STUDY/HOUSEWORK/FAMILY OR LEISURE ACTIVITIES.    I HAVE NO PROBLEMS DOING MY USUAL ACTIVITIES []   I HAVE SLIGHT PROBLEMS DOING MY USUAL ACTIVITIES [x]   I HAVE MODERATE PROBLEMS DOING MY USUAL ACTIVIITIES []   I HAVE SEVERE PROBLEMS DOING MY USUAL ACTIVITIES []   I AM UNABLE TO DO MY USUAL ACTIVITIES []     PAIN /DISCOMFORT   I HAVE NO PAIN OR DISCOMFORT [x]   I HAVE SLIGHT PAIN OR DISCOMFORT []   I HAVE MODERATE PAIN OR DISCOMFORT []   I HAVE SEVERE PAIN OR DISCOMFORT []   I HAVE EXTREME PAIN OR DISCOMFORT []     ANXIETY/DEPRESSION   I AM NOT ANXIOUS OR DEPRESSED [x]   I AM SLIGHTLY ANXIOUS OR DEPRESSED []   I AM MODERATELY ANXIOUS OR DREPRESSED []   I AM SEVERELY ANXIOUS OR DEPRESSED []   I AM EXTREMELY ANXIOUS OR DEPRESSED []     SCALE OF 0-100 HOW WOULD YOU RATE TODAY?  0 IS THE WORSE AND 100 IS THE BEST HEALTH YOU CAN IMAGINE: 80                  ABNORMAL LABS   []    No change or change not clinically significant. NO FOLLOW UP REQUIRED. [x]    Change clinically significant and attributable to disease or management. NO FOLLOW  UP REQUIRED. []   Change clinically significant and possibly attributable to study medication. NO FOLLOW UP REQUIRED. []    Change clinically significant and attributable to study medication. FOLLOW UP REQUIRED. []    Apparent lab error. []    Unevaluable.

## 2019-09-23 DIAGNOSIS — Z951 Presence of aortocoronary bypass graft: Secondary | ICD-10-CM | POA: Diagnosis not present

## 2019-09-23 DIAGNOSIS — Z48812 Encounter for surgical aftercare following surgery on the circulatory system: Secondary | ICD-10-CM | POA: Diagnosis not present

## 2019-09-24 DIAGNOSIS — Z951 Presence of aortocoronary bypass graft: Secondary | ICD-10-CM | POA: Diagnosis not present

## 2019-09-24 DIAGNOSIS — Z48812 Encounter for surgical aftercare following surgery on the circulatory system: Secondary | ICD-10-CM | POA: Diagnosis not present

## 2019-09-27 DIAGNOSIS — E113511 Type 2 diabetes mellitus with proliferative diabetic retinopathy with macular edema, right eye: Secondary | ICD-10-CM | POA: Diagnosis not present

## 2019-09-28 DIAGNOSIS — Z48812 Encounter for surgical aftercare following surgery on the circulatory system: Secondary | ICD-10-CM | POA: Diagnosis not present

## 2019-09-28 DIAGNOSIS — Z951 Presence of aortocoronary bypass graft: Secondary | ICD-10-CM | POA: Diagnosis not present

## 2019-09-30 DIAGNOSIS — Z48812 Encounter for surgical aftercare following surgery on the circulatory system: Secondary | ICD-10-CM | POA: Diagnosis not present

## 2019-09-30 DIAGNOSIS — Z951 Presence of aortocoronary bypass graft: Secondary | ICD-10-CM | POA: Diagnosis not present

## 2019-10-01 DIAGNOSIS — Z48812 Encounter for surgical aftercare following surgery on the circulatory system: Secondary | ICD-10-CM | POA: Diagnosis not present

## 2019-10-01 DIAGNOSIS — Z951 Presence of aortocoronary bypass graft: Secondary | ICD-10-CM | POA: Diagnosis not present

## 2019-10-05 DIAGNOSIS — Z48812 Encounter for surgical aftercare following surgery on the circulatory system: Secondary | ICD-10-CM | POA: Diagnosis not present

## 2019-10-05 DIAGNOSIS — Z951 Presence of aortocoronary bypass graft: Secondary | ICD-10-CM | POA: Diagnosis not present

## 2019-10-07 DIAGNOSIS — Z951 Presence of aortocoronary bypass graft: Secondary | ICD-10-CM | POA: Diagnosis not present

## 2019-10-07 DIAGNOSIS — Z48812 Encounter for surgical aftercare following surgery on the circulatory system: Secondary | ICD-10-CM | POA: Diagnosis not present

## 2019-10-08 DIAGNOSIS — Z951 Presence of aortocoronary bypass graft: Secondary | ICD-10-CM | POA: Diagnosis not present

## 2019-10-08 DIAGNOSIS — I2510 Atherosclerotic heart disease of native coronary artery without angina pectoris: Secondary | ICD-10-CM | POA: Diagnosis not present

## 2019-10-08 DIAGNOSIS — I10 Essential (primary) hypertension: Secondary | ICD-10-CM | POA: Diagnosis not present

## 2019-10-12 DIAGNOSIS — I10 Essential (primary) hypertension: Secondary | ICD-10-CM | POA: Diagnosis not present

## 2019-10-12 DIAGNOSIS — I2510 Atherosclerotic heart disease of native coronary artery without angina pectoris: Secondary | ICD-10-CM | POA: Diagnosis not present

## 2019-10-12 DIAGNOSIS — Z951 Presence of aortocoronary bypass graft: Secondary | ICD-10-CM | POA: Diagnosis not present

## 2019-10-13 DIAGNOSIS — H04123 Dry eye syndrome of bilateral lacrimal glands: Secondary | ICD-10-CM | POA: Diagnosis not present

## 2019-10-13 DIAGNOSIS — H40023 Open angle with borderline findings, high risk, bilateral: Secondary | ICD-10-CM | POA: Diagnosis not present

## 2019-10-14 DIAGNOSIS — I10 Essential (primary) hypertension: Secondary | ICD-10-CM | POA: Diagnosis not present

## 2019-10-14 DIAGNOSIS — Z951 Presence of aortocoronary bypass graft: Secondary | ICD-10-CM | POA: Diagnosis not present

## 2019-10-14 DIAGNOSIS — I2510 Atherosclerotic heart disease of native coronary artery without angina pectoris: Secondary | ICD-10-CM | POA: Diagnosis not present

## 2019-10-16 ENCOUNTER — Other Ambulatory Visit: Payer: Self-pay | Admitting: Surgical

## 2019-10-17 ENCOUNTER — Other Ambulatory Visit: Payer: Self-pay | Admitting: Surgical

## 2019-10-19 DIAGNOSIS — Z951 Presence of aortocoronary bypass graft: Secondary | ICD-10-CM | POA: Diagnosis not present

## 2019-10-19 DIAGNOSIS — I10 Essential (primary) hypertension: Secondary | ICD-10-CM | POA: Diagnosis not present

## 2019-10-19 DIAGNOSIS — I2510 Atherosclerotic heart disease of native coronary artery without angina pectoris: Secondary | ICD-10-CM | POA: Diagnosis not present

## 2019-10-21 DIAGNOSIS — Z951 Presence of aortocoronary bypass graft: Secondary | ICD-10-CM | POA: Diagnosis not present

## 2019-10-21 DIAGNOSIS — I10 Essential (primary) hypertension: Secondary | ICD-10-CM | POA: Diagnosis not present

## 2019-10-21 DIAGNOSIS — I2510 Atherosclerotic heart disease of native coronary artery without angina pectoris: Secondary | ICD-10-CM | POA: Diagnosis not present

## 2019-10-22 DIAGNOSIS — I2510 Atherosclerotic heart disease of native coronary artery without angina pectoris: Secondary | ICD-10-CM | POA: Diagnosis not present

## 2019-10-22 DIAGNOSIS — Z951 Presence of aortocoronary bypass graft: Secondary | ICD-10-CM | POA: Diagnosis not present

## 2019-10-22 DIAGNOSIS — E103511 Type 1 diabetes mellitus with proliferative diabetic retinopathy with macular edema, right eye: Secondary | ICD-10-CM | POA: Diagnosis not present

## 2019-10-22 DIAGNOSIS — Z794 Long term (current) use of insulin: Secondary | ICD-10-CM | POA: Diagnosis not present

## 2019-10-22 DIAGNOSIS — I10 Essential (primary) hypertension: Secondary | ICD-10-CM | POA: Diagnosis not present

## 2019-10-22 DIAGNOSIS — E1042 Type 1 diabetes mellitus with diabetic polyneuropathy: Secondary | ICD-10-CM | POA: Diagnosis not present

## 2019-10-26 ENCOUNTER — Encounter: Payer: Self-pay | Admitting: Cardiothoracic Surgery

## 2019-10-26 ENCOUNTER — Other Ambulatory Visit: Payer: Self-pay

## 2019-10-26 ENCOUNTER — Ambulatory Visit (INDEPENDENT_AMBULATORY_CARE_PROVIDER_SITE_OTHER): Payer: Self-pay | Admitting: Cardiothoracic Surgery

## 2019-10-26 VITALS — BP 160/80 | HR 71 | Temp 98.1°F | Resp 20 | Ht 70.0 in | Wt 171.0 lb

## 2019-10-26 DIAGNOSIS — Z951 Presence of aortocoronary bypass graft: Secondary | ICD-10-CM | POA: Diagnosis not present

## 2019-10-26 DIAGNOSIS — Z09 Encounter for follow-up examination after completed treatment for conditions other than malignant neoplasm: Secondary | ICD-10-CM

## 2019-10-26 DIAGNOSIS — I10 Essential (primary) hypertension: Secondary | ICD-10-CM | POA: Diagnosis not present

## 2019-10-26 DIAGNOSIS — I2510 Atherosclerotic heart disease of native coronary artery without angina pectoris: Secondary | ICD-10-CM | POA: Diagnosis not present

## 2019-10-26 MED ORDER — CLOPIDOGREL BISULFATE 75 MG PO TABS: 75.0000 mg | ORAL_TABLET | Freq: Every day | ORAL | 0 refills | Status: AC

## 2019-10-26 NOTE — Progress Notes (Signed)
PCP is Kathyrn Lass, MD Referring Provider is Lorretta Harp, MD  Chief Complaint  Patient presents with  . Routine Post Op    1 month f/u    HPI: Continues to do very well after CABG x3 Attending rehab at a [facility] facility and doing well Incisions healed No recurrent angina Pressure slightly increasing-he will continue to follow his blood pressure and increase his lisinopril accordingly from 10 mg daily to 20 mg daily if systolic pressure sustains 150 mmHg We discussed continuing his Plavix a full year postop and he is agreeable. He knows he will follow sternal precautions until June 1.  Past Medical History:  Diagnosis Date  . Coronary artery disease   . Diabetes mellitus without complication ([place])   . Hyperlipidemia   . Hypertension   . Neuromuscular disorder (HCC)    neuropathy in feet  . Pneumonia     Past Surgical History:  Procedure Laterality Date  . CLEFT PALATE REPAIR    . CORONARY ARTERY BYPASS GRAFT N/A 08/24/2019   Procedure: CORONARY ARTERY BYPASS GRAFTING (CABG) times three, using left radial atery harvested endoscopically and right greater saphenous vein harvested endoscopically and left internal mammary artery.;  Surgeon: Ivin Poot, MD;  Location: [facility];  Service: Open Heart Surgery;  Laterality: N/A;  swan only  . LEFT HEART CATH AND CORONARY ANGIOGRAPHY N/A 08/12/2019   Procedure: LEFT HEART CATH AND CORONARY ANGIOGRAPHY;  Surgeon: Lorretta Harp, MD;  Location: [facility];  Service: Cardiovascular;  Laterality: N/A;  . RADIAL ARTERY HARVEST Left 08/24/2019   Procedure: RADIAL ARTERY HARVEST;  Surgeon: Ivin Poot, MD;  Location: [facility];  Service: Open Heart Surgery;  Laterality: Left;  . TEE WITHOUT CARDIOVERSION N/A 08/24/2019   Procedure: TRANSESOPHAGEAL ECHOCARDIOGRAM (TEE);  Surgeon: Prescott Gum, Collier Salina, MD;  Location: [facility];  Service: Open Heart Surgery;  Laterality: N/A;    History reviewed. No pertinent family  history.  Social History Social History   Tobacco Use  . Smoking status: Never Smoker  . Smokeless tobacco: Never Used  Substance Use Topics  . Alcohol use: Yes    Comment: 3-4 oz/week vodka liquor  . Drug use: Never    Current Outpatient Medications  Medication Sig Dispense Refill  . aspirin EC 81 MG tablet Take 81 mg by mouth at bedtime.    . clopidogrel (PLAVIX) 75 MG tablet TAKE 1 TABLET BY MOUTH EVERY DAY 30 tablet 1  . docusate sodium (COLACE) 100 MG capsule Take 100 mg by mouth at bedtime.    . insulin aspart (NOVOLOG) 100 UNIT/ML injection Inject into the skin. Via Insulin PUMP    . Insulin Human (INSULIN PUMP) SOLN Inject into the skin. insulin aspart (NOVOLOG) 100 UNIT/ML injection    . latanoprost (XALATAN) 0.005 % ophthalmic solution Place 1 drop into both eyes at bedtime.     Marland Kitchen lisinopril (ZESTRIL) 10 MG tablet Take 10 mg by mouth daily.    . metoprolol tartrate (LOPRESSOR) 50 MG tablet TAKE 1 TABLET BY MOUTH TWICE A DAY 60 tablet 1  . rosuvastatin (CRESTOR) 40 MG tablet Take 1 tablet (40 mg total) by mouth at bedtime. 30 tablet 4  . timolol (TIMOPTIC) 0.5 % ophthalmic solution Place 1 drop into both eyes daily.     No current facility-administered medications for this visit.    Allergies  Allergen Reactions  . Penicillins Rash    Did it involve swelling of the face/tongue/throat, SOB, or low BP?  No Did it involve sudden or severe rash/hives, skin peeling, or any reaction on the inside of your mouth or nose?    #  #  YES  #  #  Did you need to seek medical attention at a hospital or doctor's office? No When did it last happen?childhood reaction.   . Latex Other (See Comments)    Contact dermatitis    Review of Systems  No new problems  BP (!) 160/80   Pulse 71   Temp 98.1 F (36.7 C) (Skin)   Resp 20   Ht 5\' 10"  (1.778 m)   Wt 171 lb (77.6 kg)   SpO2 100% Comment: RA  BMI 24.54 kg/m  Physical Exam      Exam    General- alert and comfortable.   Surgical incisions are well-healed.    Neck- no JVD, no cervical adenopathy palpable, no carotid bruit   Lungs- clear without rales, wheezes   Cor- regular rate and rhythm, no murmur , gallop   Abdomen- soft, non-tender   Extremities - warm, non-tender, minimal edema   Neuro- oriented, appropriate, no focal weakness  Diagnostic Tests:  None Impression: Doing very well after CABG x3. He will continue with his outpatient cardiac rehab and current medications. Sternal precautions will be lifted after June 1. He will return here as needed The patient is very familiar with heart healthy lifestyle including 30 minutes of aerobic activity 5 out of 7 days and heart healthy diet     11-29-2005, MD [facility] [phone]

## 2019-10-27 ENCOUNTER — Ambulatory Visit: Payer: BC Managed Care – PPO | Admitting: Cardiothoracic Surgery

## 2019-10-28 DIAGNOSIS — I2510 Atherosclerotic heart disease of native coronary artery without angina pectoris: Secondary | ICD-10-CM | POA: Diagnosis not present

## 2019-10-28 DIAGNOSIS — Z951 Presence of aortocoronary bypass graft: Secondary | ICD-10-CM | POA: Diagnosis not present

## 2019-10-28 DIAGNOSIS — I10 Essential (primary) hypertension: Secondary | ICD-10-CM | POA: Diagnosis not present

## 2019-10-28 MED ORDER — METOPROLOL TARTRATE 50 MG PO TABS: 50.0000 mg | ORAL_TABLET | Freq: Two times a day (BID) | ORAL | 0 refills | Status: DC

## 2019-10-29 DIAGNOSIS — E109 Type 1 diabetes mellitus without complications: Secondary | ICD-10-CM | POA: Diagnosis not present

## 2019-10-29 DIAGNOSIS — Z794 Long term (current) use of insulin: Secondary | ICD-10-CM | POA: Diagnosis not present

## 2019-10-29 DIAGNOSIS — I2510 Atherosclerotic heart disease of native coronary artery without angina pectoris: Secondary | ICD-10-CM | POA: Diagnosis not present

## 2019-10-29 DIAGNOSIS — I10 Essential (primary) hypertension: Secondary | ICD-10-CM | POA: Diagnosis not present

## 2019-10-29 DIAGNOSIS — Z951 Presence of aortocoronary bypass graft: Secondary | ICD-10-CM | POA: Diagnosis not present

## 2019-11-02 DIAGNOSIS — Z951 Presence of aortocoronary bypass graft: Secondary | ICD-10-CM | POA: Diagnosis not present

## 2019-11-02 DIAGNOSIS — I2510 Atherosclerotic heart disease of native coronary artery without angina pectoris: Secondary | ICD-10-CM | POA: Diagnosis not present

## 2019-11-02 DIAGNOSIS — I10 Essential (primary) hypertension: Secondary | ICD-10-CM | POA: Diagnosis not present

## 2019-11-03 DIAGNOSIS — E109 Type 1 diabetes mellitus without complications: Secondary | ICD-10-CM | POA: Diagnosis not present

## 2019-11-04 DIAGNOSIS — I10 Essential (primary) hypertension: Secondary | ICD-10-CM | POA: Diagnosis not present

## 2019-11-04 DIAGNOSIS — I2510 Atherosclerotic heart disease of native coronary artery without angina pectoris: Secondary | ICD-10-CM | POA: Diagnosis not present

## 2019-11-04 DIAGNOSIS — Z951 Presence of aortocoronary bypass graft: Secondary | ICD-10-CM | POA: Diagnosis not present

## 2019-11-05 DIAGNOSIS — I10 Essential (primary) hypertension: Secondary | ICD-10-CM | POA: Diagnosis not present

## 2019-11-05 DIAGNOSIS — I2510 Atherosclerotic heart disease of native coronary artery without angina pectoris: Secondary | ICD-10-CM | POA: Diagnosis not present

## 2019-11-05 DIAGNOSIS — Z951 Presence of aortocoronary bypass graft: Secondary | ICD-10-CM | POA: Diagnosis not present

## 2019-11-09 DIAGNOSIS — Z951 Presence of aortocoronary bypass graft: Secondary | ICD-10-CM | POA: Diagnosis not present

## 2019-11-09 DIAGNOSIS — I2510 Atherosclerotic heart disease of native coronary artery without angina pectoris: Secondary | ICD-10-CM | POA: Diagnosis not present

## 2019-11-09 DIAGNOSIS — I10 Essential (primary) hypertension: Secondary | ICD-10-CM | POA: Diagnosis not present

## 2019-11-10 ENCOUNTER — Other Ambulatory Visit: Payer: Self-pay

## 2019-11-10 ENCOUNTER — Ambulatory Visit: Payer: BC Managed Care – PPO | Admitting: Podiatry

## 2019-11-10 DIAGNOSIS — S90229A Contusion of unspecified lesser toe(s) with damage to nail, initial encounter: Secondary | ICD-10-CM

## 2019-11-11 DIAGNOSIS — I2510 Atherosclerotic heart disease of native coronary artery without angina pectoris: Secondary | ICD-10-CM | POA: Diagnosis not present

## 2019-11-11 DIAGNOSIS — I10 Essential (primary) hypertension: Secondary | ICD-10-CM | POA: Diagnosis not present

## 2019-11-11 DIAGNOSIS — Z951 Presence of aortocoronary bypass graft: Secondary | ICD-10-CM | POA: Diagnosis not present

## 2019-11-12 DIAGNOSIS — I2510 Atherosclerotic heart disease of native coronary artery without angina pectoris: Secondary | ICD-10-CM | POA: Diagnosis not present

## 2019-11-12 DIAGNOSIS — I10 Essential (primary) hypertension: Secondary | ICD-10-CM | POA: Diagnosis not present

## 2019-11-12 DIAGNOSIS — Z794 Long term (current) use of insulin: Secondary | ICD-10-CM | POA: Diagnosis not present

## 2019-11-12 DIAGNOSIS — E109 Type 1 diabetes mellitus without complications: Secondary | ICD-10-CM | POA: Diagnosis not present

## 2019-11-12 DIAGNOSIS — Z951 Presence of aortocoronary bypass graft: Secondary | ICD-10-CM | POA: Diagnosis not present

## 2019-11-16 ENCOUNTER — Other Ambulatory Visit: Payer: Self-pay

## 2019-11-16 MED ORDER — ROSUVASTATIN CALCIUM 40 MG PO TABS: 40.0000 mg | ORAL_TABLET | Freq: Every day | ORAL | 3 refills | Status: DC

## 2019-11-16 MED ORDER — METOPROLOL TARTRATE 50 MG PO TABS: 50.0000 mg | ORAL_TABLET | Freq: Two times a day (BID) | ORAL | 3 refills | Status: DC

## 2019-11-16 NOTE — Progress Notes (Signed)
   HPI: 59 y.o. male presenting today as a new patient with a chief complaint of a nonpainful discoloration of the left great toenail that appeared about 6 months ago. He believes it may be a possible hematoma. He has not had any treatment for the symptoms and denies any modifying factors. Patient is here for further evaluation and treatment.   Past Medical History:  Diagnosis Date  . Coronary artery disease   . Diabetes mellitus without complication (HCC)   . Hyperlipidemia   . Hypertension   . Neuromuscular disorder (HCC)    neuropathy in feet  . Pneumonia      Physical Exam: General: The patient is alert and oriented x3 in no acute distress.  Dermatology: Subungual hematoma noted to the left hallux nail plate. Skin is warm, dry and supple bilateral lower extremities. Negative for open lesions or macerations.  Vascular: Palpable pedal pulses bilaterally. No edema or erythema noted. Capillary refill within normal limits.  Neurological: Epicritic and protective threshold grossly intact bilaterally.   Musculoskeletal Exam: Range of motion within normal limits to all pedal and ankle joints bilateral. Muscle strength 5/5 in all groups bilateral.    Assessment: 1. Subungual hematoma noted to the left hallux nail plate    Plan of Care:  1. Patient evaluated.  2. Explained that it should grow out over the next 6-9 months.  3. Not concerning for Melanoma.  4. Return to clinic as needed.   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      Felecia Shelling, DPM [facility]  Dr. Felecia Shelling, DPM    [address] [place], [place] [address]                Office [phone]  Fax [phone]

## 2019-11-22 VITALS — BP 146/77 | HR 64 | Temp 98.8°F | Resp 16 | Wt 171.0 lb

## 2019-11-22 DIAGNOSIS — Z006 Encounter for examination for normal comparison and control in clinical research program: Secondary | ICD-10-CM

## 2019-11-22 NOTE — Research (Addendum)
Harry Moore  Visit 10/90 Day/EOS  Visit 10 labs and urine collected.  EQ-5D-5L  MOBILITY:    I HAVE NO PROBLEMS WALKING [x]   I HAVE SLIGHT PROBLEMS WALKING []   I HAVE MODERATE PROBLEMS WALKING []   I HAVE SEVERE PROBLEMS WALKING []   I AM UNABLE TO WALK  []     SELF-CARE:   I HAVE NO PROBLEMS WASHING OR DRESSING MYSELF  [x]   I HAVE SLIGHT PROBLEMS WASHING OR DRESSING MYSELF  []   I HAVE MODERATE PROBLEMS WASHING OR DRESSING MYSELF []   I HAVE SEVERE PROBLEMS WASHING OR DRESSING MYSELF  []   I HAVE SEVERE PROBLEMS WASHING OR DRESSING MYSELF  []   I AM UNABLE TO WASH OR DRESS MYSELF []     USUAL ACTIVITIES: (E.G. WORK/Moore/HOUSEWORK/FAMILY OR LEISURE ACTIVITIES.    I HAVE NO PROBLEMS DOING MY USUAL ACTIVITIES [x]   I HAVE SLIGHT PROBLEMS DOING MY USUAL ACTIVITIES []   I HAVE MODERATE PROBLEMS DOING MY USUAL ACTIVIITIES []   I HAVE SEVERE PROBLEMS DOING MY USUAL ACTIVITIES []   I AM UNABLE TO DO MY USUAL ACTIVITIES []     PAIN /DISCOMFORT   I HAVE NO PAIN OR DISCOMFORT []   I HAVE SLIGHT PAIN OR DISCOMFORT [x]   I HAVE MODERATE PAIN OR DISCOMFORT []   I HAVE SEVERE PAIN OR DISCOMFORT []   I HAVE EXTREME PAIN OR DISCOMFORT []     ANXIETY/DEPRESSION   I AM NOT ANXIOUS OR DEPRESSED [x]   I AM SLIGHTLY ANXIOUS OR DEPRESSED []   I AM MODERATELY ANXIOUS OR DREPRESSED []   I AM SEVERELY ANXIOUS OR DEPRESSED []   I AM EXTREMELY ANXIOUS OR DEPRESSED []     SCALE OF 0-100 HOW WOULD YOU RATE TODAY?  0 IS THE WORSE AND 100 IS THE BEST HEALTH YOU CAN IMAGINE: 90     Current Outpatient Medications:  .  aspirin EC 81 MG tablet, Take 81 mg by mouth at bedtime., Disp: , Rfl:  .  clopidogrel (PLAVIX) 75 MG tablet, Take 1 tablet (75 mg total) by mouth daily., Disp: 90 tablet, Rfl: 0 .  docusate sodium (COLACE) 100 MG capsule, Take 100 mg by mouth at bedtime., Disp: , Rfl:  .  insulin aspart (NOVOLOG) 100 UNIT/ML injection, Inject into the skin. Via Insulin PUMP, Disp: , Rfl:  .  Insulin Human  (INSULIN PUMP) SOLN, Inject into the skin. insulin aspart (NOVOLOG) 100 UNIT/ML injection, Disp: , Rfl:  .  latanoprost (XALATAN) 0.005 % ophthalmic solution, Place 1 drop into both eyes at bedtime. , Disp: , Rfl:  .  lisinopril (ZESTRIL) 10 MG tablet, Take 10 mg by mouth daily., Disp: , Rfl:  .  metoprolol tartrate (LOPRESSOR) 50 MG tablet, Take 1 tablet (50 mg total) by mouth 2 (two) times daily. Due for appt please schedule appt, Disp: 180 tablet, Rfl: 3 .  rosuvastatin (CRESTOR) 40 MG tablet, Take 1 tablet (40 mg total) by mouth at bedtime., Disp: 90 tablet, Rfl: 3 .  timolol (TIMOPTIC) 0.5 % ophthalmic solution, Place 1 drop into both eyes daily., Disp: , Rfl:               ABNORMAL LABS   []    No change or change not clinically significant. NO FOLLOW UP REQUIRED. [x]    Change clinically significant and attributable to disease or management. NO FOLLOW  UP REQUIRED. []    Change clinically significant and possibly attributable to Moore medication. NO FOLLOW UP REQUIRED. []    Change clinically significant and attributable to Moore medication. FOLLOW UP REQUIRED. []   Apparent lab error. []    Unevaluable.

## 2019-11-23 DIAGNOSIS — Z951 Presence of aortocoronary bypass graft: Secondary | ICD-10-CM | POA: Diagnosis not present

## 2019-11-23 DIAGNOSIS — I10 Essential (primary) hypertension: Secondary | ICD-10-CM | POA: Diagnosis not present

## 2019-11-23 DIAGNOSIS — I2510 Atherosclerotic heart disease of native coronary artery without angina pectoris: Secondary | ICD-10-CM | POA: Diagnosis not present

## 2019-11-24 DIAGNOSIS — E113511 Type 2 diabetes mellitus with proliferative diabetic retinopathy with macular edema, right eye: Secondary | ICD-10-CM | POA: Diagnosis not present

## 2019-11-25 DIAGNOSIS — I10 Essential (primary) hypertension: Secondary | ICD-10-CM | POA: Diagnosis not present

## 2019-11-25 DIAGNOSIS — Z951 Presence of aortocoronary bypass graft: Secondary | ICD-10-CM | POA: Diagnosis not present

## 2019-11-25 DIAGNOSIS — I2510 Atherosclerotic heart disease of native coronary artery without angina pectoris: Secondary | ICD-10-CM | POA: Diagnosis not present

## 2019-11-26 DIAGNOSIS — I2510 Atherosclerotic heart disease of native coronary artery without angina pectoris: Secondary | ICD-10-CM | POA: Diagnosis not present

## 2019-11-26 DIAGNOSIS — Z951 Presence of aortocoronary bypass graft: Secondary | ICD-10-CM | POA: Diagnosis not present

## 2019-11-26 DIAGNOSIS — I10 Essential (primary) hypertension: Secondary | ICD-10-CM | POA: Diagnosis not present

## 2019-11-30 DIAGNOSIS — I10 Essential (primary) hypertension: Secondary | ICD-10-CM | POA: Diagnosis not present

## 2019-11-30 DIAGNOSIS — I2510 Atherosclerotic heart disease of native coronary artery without angina pectoris: Secondary | ICD-10-CM | POA: Diagnosis not present

## 2019-11-30 DIAGNOSIS — Z951 Presence of aortocoronary bypass graft: Secondary | ICD-10-CM | POA: Diagnosis not present

## 2019-12-01 DIAGNOSIS — I25110 Atherosclerotic heart disease of native coronary artery with unstable angina pectoris: Secondary | ICD-10-CM | POA: Diagnosis not present

## 2019-12-01 DIAGNOSIS — E785 Hyperlipidemia, unspecified: Secondary | ICD-10-CM | POA: Diagnosis not present

## 2019-12-01 DIAGNOSIS — R972 Elevated prostate specific antigen [PSA]: Secondary | ICD-10-CM | POA: Diagnosis not present

## 2019-12-01 DIAGNOSIS — I10 Essential (primary) hypertension: Secondary | ICD-10-CM | POA: Diagnosis not present

## 2019-12-01 DIAGNOSIS — E1042 Type 1 diabetes mellitus with diabetic polyneuropathy: Secondary | ICD-10-CM | POA: Diagnosis not present

## 2019-12-02 DIAGNOSIS — I2510 Atherosclerotic heart disease of native coronary artery without angina pectoris: Secondary | ICD-10-CM | POA: Diagnosis not present

## 2019-12-02 DIAGNOSIS — Z951 Presence of aortocoronary bypass graft: Secondary | ICD-10-CM | POA: Diagnosis not present

## 2019-12-02 DIAGNOSIS — I10 Essential (primary) hypertension: Secondary | ICD-10-CM | POA: Diagnosis not present

## 2019-12-03 DIAGNOSIS — I2510 Atherosclerotic heart disease of native coronary artery without angina pectoris: Secondary | ICD-10-CM | POA: Diagnosis not present

## 2019-12-03 DIAGNOSIS — Z951 Presence of aortocoronary bypass graft: Secondary | ICD-10-CM | POA: Diagnosis not present

## 2019-12-03 DIAGNOSIS — I10 Essential (primary) hypertension: Secondary | ICD-10-CM | POA: Diagnosis not present

## 2019-12-07 DIAGNOSIS — Z951 Presence of aortocoronary bypass graft: Secondary | ICD-10-CM | POA: Diagnosis not present

## 2019-12-07 DIAGNOSIS — Z48812 Encounter for surgical aftercare following surgery on the circulatory system: Secondary | ICD-10-CM | POA: Diagnosis not present

## 2019-12-15 ENCOUNTER — Ambulatory Visit: Payer: BC Managed Care – PPO | Admitting: Cardiovascular Disease

## 2019-12-21 DIAGNOSIS — Z48812 Encounter for surgical aftercare following surgery on the circulatory system: Secondary | ICD-10-CM | POA: Diagnosis not present

## 2019-12-21 DIAGNOSIS — Z951 Presence of aortocoronary bypass graft: Secondary | ICD-10-CM | POA: Diagnosis not present

## 2019-12-23 DIAGNOSIS — Z951 Presence of aortocoronary bypass graft: Secondary | ICD-10-CM | POA: Diagnosis not present

## 2019-12-23 DIAGNOSIS — Z48812 Encounter for surgical aftercare following surgery on the circulatory system: Secondary | ICD-10-CM | POA: Diagnosis not present

## 2019-12-24 DIAGNOSIS — Z48812 Encounter for surgical aftercare following surgery on the circulatory system: Secondary | ICD-10-CM | POA: Diagnosis not present

## 2019-12-24 DIAGNOSIS — Z951 Presence of aortocoronary bypass graft: Secondary | ICD-10-CM | POA: Diagnosis not present

## 2020-01-03 DIAGNOSIS — Z79899 Other long term (current) drug therapy: Secondary | ICD-10-CM | POA: Diagnosis not present

## 2020-01-03 DIAGNOSIS — E785 Hyperlipidemia, unspecified: Secondary | ICD-10-CM | POA: Diagnosis not present

## 2020-01-03 DIAGNOSIS — Z951 Presence of aortocoronary bypass graft: Secondary | ICD-10-CM | POA: Diagnosis not present

## 2020-01-03 DIAGNOSIS — I10 Essential (primary) hypertension: Secondary | ICD-10-CM | POA: Diagnosis not present

## 2020-01-03 DIAGNOSIS — E782 Mixed hyperlipidemia: Secondary | ICD-10-CM | POA: Diagnosis not present

## 2020-01-04 ENCOUNTER — Ambulatory Visit: Payer: BC Managed Care – PPO | Admitting: Cardiovascular Disease

## 2020-01-04 ENCOUNTER — Other Ambulatory Visit: Payer: Self-pay

## 2020-01-04 ENCOUNTER — Encounter: Payer: Self-pay | Admitting: Cardiovascular Disease

## 2020-01-04 DIAGNOSIS — I10 Essential (primary) hypertension: Secondary | ICD-10-CM | POA: Diagnosis not present

## 2020-01-04 DIAGNOSIS — Z951 Presence of aortocoronary bypass graft: Secondary | ICD-10-CM

## 2020-01-04 DIAGNOSIS — E109 Type 1 diabetes mellitus without complications: Secondary | ICD-10-CM | POA: Diagnosis not present

## 2020-01-04 DIAGNOSIS — E782 Mixed hyperlipidemia: Secondary | ICD-10-CM

## 2020-01-04 LAB — HEPATIC FUNCTION PANEL
ALT: 19 IU/L (ref 0–44)
AST: 16 IU/L (ref 0–40)
Albumin: 3.8 g/dL (ref 3.8–4.9)
Alkaline Phosphatase: 77 IU/L (ref 48–121)
Bilirubin Total: 0.3 mg/dL (ref 0.0–1.2)
Bilirubin, Direct: 0.11 mg/dL (ref 0.00–0.40)
Total Protein: 6.7 g/dL (ref 6.0–8.5)

## 2020-01-04 NOTE — Assessment & Plan Note (Signed)
History of CAD status post cardiac catheterization by myself performed 08/12/2019 revealing three-vessel disease.  He underwent CABG x3 by Dr. Maren Beach 08/24/2019 with a LIMA to the LAD, free radial to the first marginal branch and vein to the diagonal branch.  He has done well since.  He just finished cardiac rehab.  He denies chest pain or shortness of breath.

## 2020-01-04 NOTE — Patient Instructions (Signed)
Medication Instructions:   NO CHANGE  *If you need a refill on your cardiac medications before your next appointment, please call your pharmacy*   Lab Work: If you have labs (blood work) drawn today and your tests are completely normal, you will receive your results only by: Marland Kitchen MyChart Message (if you have MyChart) OR . A paper copy in the mail If you have any lab test that is abnormal or we need to change your treatment, we will call you to review the results.   Follow-Up: At [facility], you and your health needs are our priority.  As part of our continuing mission to provide you with exceptional heart care, we have created designated Provider Care Teams.  These Care Teams include your primary Cardiologist (physician) and Advanced Practice Providers (APPs -  Physician Assistants and Nurse Practitioners) who all work together to provide you with the care you need, when you need it.  We recommend signing up for the patient portal called "MyChart".  Sign up information is provided on this After Visit Summary.  MyChart is used to connect with patients for Virtual Visits (Telemedicine).  Patients are able to view lab/test results, encounter notes, upcoming appointments, etc.  Non-urgent messages can be sent to your provider as well.   To learn more about what you can do with MyChart, go to [email or web address].    Your next appointment:    Your physician recommends that you schedule a follow-up appointment in: 6 MONTHS WITH JESSE CLEAVER NP  Your physician wants you to follow-up in: ONE YEAR WITH DR San Morelle will receive a reminder letter in the mail two months in advance. If you don't receive a letter, please call our office to schedule the follow-up appointment.

## 2020-01-04 NOTE — Progress Notes (Signed)
01/04/2020 [name]   17-Aug-1960  [id number]  Primary Physician [name], MD Primary Cardiologist: [name] MD Nicholes Calamity, [place]  HPI:  Harry Moore is a 59 y.o.  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 He was referred by Dr. [name] for cardiovascular dilation because of chest pain. 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 He retired 4 years ago. History factors include type 1 diabetes since age 41 currently on insulin pump, treated hypertension and hyperlipidemia. His father did have CAD and had CABG at age 60 and died in his 72s. Dr. Daphene Jaeger was his cardiologist. He has had chest pain for a year which is mostly exertional but occurs randomly with different activities. He was seen in the ER for this in [place] and ruled out for myocardial infarction.   He did have a coronary CTA performed 07/27/2019 revealing three-vessel disease, moderate in the circumflex and LAD.  Based on this, I performed outpatient diagnostic coronary angiography 08/26/2019 revealing three-vessel disease and normal LV function.  He had CABG x3 on 08/24/2019 by Dr. Maren Beach with a LIMA to his LAD, free radial to an obtuse marginal branch and vein to diagonal branch.  He did participate in cardiac rehab after that and just recently finished the program.  He denies chest pain or shortness of breath.  Current Meds  Medication Sig  . aspirin EC 81 MG tablet Take 81 mg by mouth at bedtime.  . clopidogrel (PLAVIX) 75 MG tablet Take 1 tablet (75 mg total) by mouth daily.  Marland Kitchen docusate sodium (COLACE) 100 MG capsule Take 100 mg by mouth at bedtime.  . insulin aspart (NOVOLOG) 100 UNIT/ML injection Inject into the skin. Via Insulin PUMP  . Insulin Human (INSULIN PUMP) SOLN Inject into the skin.  insulin aspart (NOVOLOG) 100 UNIT/ML injection  . latanoprost (XALATAN) 0.005 % ophthalmic solution Place 1 drop into both eyes at bedtime.   Marland Kitchen lisinopril (ZESTRIL) 20 MG tablet Take 20 mg by mouth at bedtime.  . metoprolol tartrate (LOPRESSOR) 50 MG tablet Take 1 tablet (50 mg total) by mouth 2 (two) times daily. Due for appt please schedule appt  . rosuvastatin (CRESTOR) 40 MG tablet Take 1 tablet (40 mg total) by mouth at bedtime.  . timolol (TIMOPTIC) 0.5 % ophthalmic solution Place 1 drop into both eyes daily.     Allergies  Allergen Reactions  . Penicillins Rash    Did it involve swelling of the face/tongue/throat, SOB, or low BP? No Did it involve sudden or severe rash/hives, skin peeling, or any reaction on the inside of your mouth or nose?    #  #  YES  #  #  Did you need to seek medical attention at a hospital or doctor's office? No When did it last happen?childhood reaction.   . Latex Other (See Comments)    Contact dermatitis    Social History   Socioeconomic History  . Marital status: Single    Spouse name: Not on file  . Number of children: Not on file  . Years of education: Not on file  . Highest education level: Not on file  Occupational History  . Not on file  Tobacco Use  . Smoking status: Never Smoker  . Smokeless tobacco: Never Used  Vaping Use  . Vaping Use: Never used  Substance and Sexual Activity  . Alcohol use: Yes    Comment: 3-4 oz/week vodka liquor  . Drug use: Never  . Sexual activity: Not on file  Other Topics Concern  . Not on file  Social History Narrative  . Not on file   Social Determinants of Health   Financial Resource Strain:   . Difficulty of Paying Living Expenses:   Food Insecurity:   . Worried About Programme researcher, broadcasting/film/video in the Last Year:   . Barista in the Last Year:   Transportation Needs:   . Freight forwarder (Medical):   Marland Kitchen Lack of Transportation (Non-Medical):   Physical Activity:   . Days of Exercise per  Week:   . Minutes of Exercise per Session:   Stress:   . Feeling of Stress :   Social Connections:   . Frequency of Communication with Friends and Family:   . Frequency of Social Gatherings with Friends and Family:   . Attends Religious Services:   . Active Member of Clubs or Organizations:   . Attends Banker Meetings:   Marland Kitchen Marital Status:   Intimate Partner Violence:   . Fear of Current or Ex-Partner:   . Emotionally Abused:   Marland Kitchen Physically Abused:   . Sexually Abused:      Review of Systems: General: negative for chills, fever, night sweats or weight changes.  Cardiovascular: negative for chest pain, dyspnea on exertion, edema, orthopnea, palpitations, paroxysmal nocturnal dyspnea or shortness of breath Dermatological: negative for rash Respiratory: negative for cough or wheezing Urologic: negative for hematuria Abdominal: negative for nausea, vomiting, diarrhea, bright red blood per rectum, melena, or hematemesis Neurologic: negative for visual changes, syncope, or dizziness All other systems reviewed and are otherwise negative except as noted above.    Blood pressure (!) 170/82, pulse 63, height 5\' 10"  (1.778 m), weight 175 lb 3.2 oz (79.5 kg), SpO2 99 %.  General appearance: alert and no distress Neck: no adenopathy, no carotid bruit, no JVD, supple, symmetrical, trachea midline and thyroid not enlarged, symmetric, no tenderness/mass/nodules Lungs: clear to auscultation bilaterally Heart: regular rate and rhythm, S1, S2 normal, no murmur, click, rub or gallop Extremities: extremities normal, atraumatic, no cyanosis or edema Pulses: 2+ and symmetric Skin: Skin color, texture, turgor normal. No rashes or lesions Neurologic: Alert and oriented X 3, normal strength and tone. Normal symmetric reflexes. Normal coordination and gait  EKG not performed today  ASSESSMENT AND PLAN:   Essential hypertension History of essential hypertension blood pressure measured  today 170/82.  Follow-up blood pressure in the office at the end of the visit was 142/60.  He is on lisinopril and metoprolol.  Hyperlipidemia History of hyperlipidemia on statin therapy with lipid profile performed 07/28/2019 revealing total cholesterol one thirty-nine, LDL of eighty-three and HDL forty-one.  Insulin dependent diabetes mellitus type IA (HCC) History of juvenile onset diabetes on insulin pump since age 73.  S/P CABG x 4 History of CAD status post cardiac catheterization by myself performed 08/12/2019 revealing three-vessel disease.  He underwent CABG x3 by Dr. 10/10/2019 08/24/2019 with a LIMA to the LAD, free radial to the first marginal branch and vein to the diagonal branch.  He has done well since.  He just finished cardiac rehab.  He denies chest pain or shortness of breath.      08/26/2019 MD FACP,FACC,FAHA, [facility] 01/04/2020 3:03 PM

## 2020-01-04 NOTE — Assessment & Plan Note (Addendum)
History of essential hypertension blood pressure measured today 170/82.  Follow-up blood pressure in the office at the end of the visit was 142/60.  He is on lisinopril and metoprolol.

## 2020-01-04 NOTE — Assessment & Plan Note (Signed)
History of juvenile onset diabetes on insulin pump since age 59.

## 2020-01-04 NOTE — Assessment & Plan Note (Signed)
History of hyperlipidemia on statin therapy with lipid profile performed 07/28/2019 revealing total cholesterol one thirty-nine, LDL of eighty-three and HDL forty-one.

## 2020-01-05 DIAGNOSIS — I10 Essential (primary) hypertension: Secondary | ICD-10-CM

## 2020-01-05 DIAGNOSIS — Z79899 Other long term (current) drug therapy: Secondary | ICD-10-CM

## 2020-01-05 DIAGNOSIS — Z951 Presence of aortocoronary bypass graft: Secondary | ICD-10-CM

## 2020-01-05 DIAGNOSIS — E113511 Type 2 diabetes mellitus with proliferative diabetic retinopathy with macular edema, right eye: Secondary | ICD-10-CM | POA: Diagnosis not present

## 2020-01-05 DIAGNOSIS — E782 Mixed hyperlipidemia: Secondary | ICD-10-CM

## 2020-01-19 ENCOUNTER — Other Ambulatory Visit: Payer: Self-pay | Admitting: Cardiothoracic Surgery

## 2020-01-27 ENCOUNTER — Other Ambulatory Visit: Payer: Self-pay | Admitting: Cardiothoracic Surgery

## 2020-01-27 ENCOUNTER — Other Ambulatory Visit: Payer: Self-pay | Admitting: *Deleted

## 2020-01-27 MED ORDER — CLOPIDOGREL BISULFATE 75 MG PO TABS: 75.0000 mg | ORAL_TABLET | Freq: Every day | ORAL | 3 refills | Status: AC

## 2020-02-23 DIAGNOSIS — E113511 Type 2 diabetes mellitus with proliferative diabetic retinopathy with macular edema, right eye: Secondary | ICD-10-CM | POA: Diagnosis not present

## 2020-02-24 DIAGNOSIS — E109 Type 1 diabetes mellitus without complications: Secondary | ICD-10-CM | POA: Diagnosis not present

## 2020-02-24 DIAGNOSIS — Z794 Long term (current) use of insulin: Secondary | ICD-10-CM | POA: Diagnosis not present

## 2020-03-02 DIAGNOSIS — E1042 Type 1 diabetes mellitus with diabetic polyneuropathy: Secondary | ICD-10-CM | POA: Diagnosis not present

## 2020-03-02 DIAGNOSIS — E1165 Type 2 diabetes mellitus with hyperglycemia: Secondary | ICD-10-CM | POA: Diagnosis not present

## 2020-03-02 DIAGNOSIS — Z794 Long term (current) use of insulin: Secondary | ICD-10-CM | POA: Diagnosis not present

## 2020-03-02 DIAGNOSIS — E103511 Type 1 diabetes mellitus with proliferative diabetic retinopathy with macular edema, right eye: Secondary | ICD-10-CM | POA: Diagnosis not present

## 2020-03-02 DIAGNOSIS — I10 Essential (primary) hypertension: Secondary | ICD-10-CM | POA: Diagnosis not present

## 2020-03-22 DIAGNOSIS — E109 Type 1 diabetes mellitus without complications: Secondary | ICD-10-CM | POA: Diagnosis not present

## 2020-04-05 DIAGNOSIS — E113511 Type 2 diabetes mellitus with proliferative diabetic retinopathy with macular edema, right eye: Secondary | ICD-10-CM | POA: Diagnosis not present

## 2020-05-24 DIAGNOSIS — E113511 Type 2 diabetes mellitus with proliferative diabetic retinopathy with macular edema, right eye: Secondary | ICD-10-CM | POA: Diagnosis not present

## 2020-05-29 DIAGNOSIS — E1165 Type 2 diabetes mellitus with hyperglycemia: Secondary | ICD-10-CM | POA: Diagnosis not present

## 2020-05-29 DIAGNOSIS — E103511 Type 1 diabetes mellitus with proliferative diabetic retinopathy with macular edema, right eye: Secondary | ICD-10-CM | POA: Diagnosis not present

## 2020-05-29 DIAGNOSIS — E1042 Type 1 diabetes mellitus with diabetic polyneuropathy: Secondary | ICD-10-CM | POA: Diagnosis not present

## 2020-05-29 DIAGNOSIS — I10 Essential (primary) hypertension: Secondary | ICD-10-CM | POA: Diagnosis not present

## 2020-05-29 DIAGNOSIS — Z794 Long term (current) use of insulin: Secondary | ICD-10-CM | POA: Diagnosis not present

## 2020-06-07 DIAGNOSIS — Z794 Long term (current) use of insulin: Secondary | ICD-10-CM | POA: Diagnosis not present

## 2020-06-07 DIAGNOSIS — E109 Type 1 diabetes mellitus without complications: Secondary | ICD-10-CM | POA: Diagnosis not present

## 2020-06-16 DIAGNOSIS — E109 Type 1 diabetes mellitus without complications: Secondary | ICD-10-CM | POA: Diagnosis not present

## 2020-07-21 ENCOUNTER — Ambulatory Visit: Payer: BC Managed Care – PPO | Admitting: General Practice

## 2020-08-07 NOTE — Progress Notes (Deleted)
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  Past Medical History    Past Medical History:  Diagnosis Date  . Coronary artery disease   . Diabetes mellitus without complication (HCC)   . Hyperlipidemia   . Hypertension   . Neuromuscular disorder (HCC)    neuropathy in feet  . Pneumonia    Past Surgical History:  Procedure Laterality Date  . CLEFT PALATE REPAIR    . CORONARY ARTERY BYPASS GRAFT N/A 08/24/2019   Procedure: CORONARY ARTERY BYPASS GRAFTING (CABG) times three, using left radial atery harvested endoscopically and right greater saphenous vein harvested endoscopically and left internal mammary artery.;  Surgeon: Kerin Perna, MD;  Location: [facility] OR;  Service: Open Heart Surgery;  Laterality: N/A;  swan only  . LEFT HEART CATH AND CORONARY ANGIOGRAPHY N/A 08/12/2019   Procedure: LEFT HEART CATH AND CORONARY ANGIOGRAPHY;  Surgeon: [name], MD;  Location: [facility] INVASIVE CV LAB;  Service: Cardiovascular;  Laterality: N/A;  . RADIAL ARTERY HARVEST Left 08/24/2019   Procedure: RADIAL ARTERY HARVEST;  Surgeon: Kerin Perna, MD;  Location: [facility] OR;  Service: Open Heart Surgery;  Laterality: Left;  . TEE WITHOUT CARDIOVERSION N/A 08/24/2019   Procedure: TRANSESOPHAGEAL ECHOCARDIOGRAM (TEE);  Surgeon: Donata Clay, Theron Arista, MD;  Location: [facility] OR;  Service: Open Heart Surgery;  Laterality: N/A;    Allergies  Allergies  Allergen Reactions  . Penicillins Rash    Did it involve swelling of the face/tongue/throat, SOB, or low BP? No Did it involve sudden or severe rash/hives, skin peeling, or any reaction on the inside of your mouth or nose?    #  #  YES  #  #  Did  you need to seek medical attention at a hospital or doctor's office? No When did it last happen?childhood reaction.   . 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 [name] that showed stenosis of proximal LAD and OM1, RCA with no significant stenosis.  Normal LVEDP.  His PMH also includes essential hypertension, hyperlipidemia, chest pain of uncertain etiology, and family history of heart disease.  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  He was referred by Dr. [name] for cardiovascular evaluation due to chest discomfort.  He was seen in the office by Dr. [name] 06/01/2019.  He is from [facility].  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  He then retired 4 years ago.  He has had known type 1 diabetes since the age of 89 and now uses an insulin pump.  His father had coronary artery disease and had CABG at age 75.  He was noted to have had chest pain for a year which was exertional in nature but occurred randomly with different activities.  He was seen in the emergency room in [place] and ruled out for MI.  He had a coronary CTA on 07/27/2019 which showed three-vessel CAD, moderate  in circumflex and LAD.  He underwent cardiac catheterization 08/12/2019 which showed moderate left main disease as well as proximal and mid LAD and segmental proximal circumflex within normal left ventricular function.  He was then referred to CVTS for further evaluation.  He underwent CABG x3 08/24/2019.  He contacted cardiac surgery triage on 09/07/2019 and indicated that he had been lightheaded particularly when standing and walking.  His blood pressure at that time was reported at 120/80 and after standing it was 105/60.  His lisinopril was  discontinued, his Lopressor was decreased to 25 mg twice daily and he was encouraged to increase his p.o. hydration.  He presented to the clinic 09/14/2019 for follow-up evaluation and stated he felt well.  He had been walking up and down his driveway,  started cardiac rehab .  They had started him with 6-minute walks and he had been tolerating it well.  He was compliant with sternal precautions.  Left radial, sternal and chest tube sites were clean dry intact open to air.  No drainage.  He contacted cardiac surgery 09/07/2019 with symptoms of lightheadedness and has stopped his lisinopril.  He continued with metoprolol tartrate 50 mg twice daily.  Has had some asymptomatic tachycardia in the a.m. and wished to continue 50 mg dosing.  Had taken rosuvastatin 20 mg for the past 6 months LDL at 83.  I increased his rosuvastatin to 40 mg daily and planned to reevaluate lipid panel and LFTs in 3 months.  Planned follow-up with Dr. [name] in 3 months.    Today he denies chest pain, shortness of breath, lower extremity edema, fatigue, palpitations,  weakness, presyncope, and syncope.   Home Medications    Prior to Admission medications   Medication Sig Start Date End Date Taking? Authorizing Provider  aspirin EC 81 MG tablet Take 81 mg by mouth at bedtime.    [provider]  clopidogrel (PLAVIX) 75 MG tablet Take 1 tablet (75 mg total) by mouth daily. 01/27/20   [name], MD  docusate sodium (COLACE) 100 MG capsule Take 100 mg by mouth at bedtime.    [provider]  insulin aspart (NOVOLOG) 100 UNIT/ML injection Inject into the skin. Via Insulin PUMP    [provider]  Insulin Human (INSULIN PUMP) SOLN Inject into the skin. insulin aspart (NOVOLOG) 100 UNIT/ML injection    [provider]  latanoprost (XALATAN) 0.005 % ophthalmic solution Place 1 drop into both eyes at bedtime.     [provider]  lisinopril (ZESTRIL) 20 MG tablet Take 20 mg by mouth  at bedtime. 11/25/19   [provider]  metoprolol tartrate (LOPRESSOR) 50 MG tablet Take 1 tablet (50 mg total) by mouth 2 (two) times daily. Due for appt please schedule appt 11/16/19   [name], MD  rosuvastatin (CRESTOR) 40 MG tablet Take 1 tablet (40 mg total) by mouth at bedtime. 11/16/19   [name], MD  timolol (TIMOPTIC) 0.5 % ophthalmic solution Place 1 drop into both eyes daily.    [provider]    Family History    No family history on file. has no family status information on file.   Social History    Social History   Socioeconomic History  . Marital status: Single    Spouse name: Not on file  . Number of children: Not on file  . Years of education: Not on file  . Highest education level: Not on file  Occupational History  .  Not on file  Tobacco Use  . Smoking status: Never Smoker  . Smokeless tobacco: Never Used  Vaping Use  . Vaping Use: Never used  Substance and Sexual Activity  . Alcohol use: Yes    Comment: 3-4 oz/week vodka liquor  . Drug use: Never  . Sexual activity: Not on file  Other Topics Concern  . Not on file  Social History Narrative  . Not on file   Social Determinants of Health   Financial Resource Strain: Not on file  Food Insecurity: Not on file  Transportation Needs: Not on file  Physical Activity: Not on file  Stress: Not on file  Social Connections: Not on file  Intimate Partner Violence: Not on file     Review of Systems    General:  No chills, fever, night sweats or weight changes.  Cardiovascular:  No chest pain, dyspnea on exertion, edema, orthopnea, palpitations, paroxysmal nocturnal dyspnea. Dermatological: No rash, lesions/masses Respiratory: No cough, dyspnea Urologic: No hematuria, dysuria Abdominal:   No nausea, vomiting, diarrhea, bright red blood per rectum, melena, or hematemesis Neurologic:  No visual changes, wkns, changes in mental status. All other systems reviewed and are  otherwise negative except as noted above.  Physical Exam    VS:  There were no vitals taken for this visit. , BMI There is no height or weight on file to calculate BMI. GEN: Well nourished, well developed, in no acute distress. HEENT: normal. Neck: Supple, no JVD, carotid bruits, or masses. Cardiac: RRR, no murmurs, rubs, or gallops. No clubbing, cyanosis, edema.  Radials/DP/PT 2+ and equal bilaterally.  Respiratory:  Respirations regular and unlabored, clear to auscultation bilaterally. GI: Soft, nontender, nondistended, BS + x 4. MS: no deformity or atrophy. Skin: warm and dry, no rash. Neuro:  Strength and sensation are intact. Psych: Normal affect.  Accessory Clinical Findings    Recent Labs: 08/29/2019: Magnesium 1.9 08/30/2019: BUN 15; Creatinine, Ser 0.94; Hemoglobin 11.3; Platelets 278; Potassium 3.6; Sodium 138 01/03/2020: ALT 19   Recent Lipid Panel    Component Value Date/Time   CHOL 139 08/05/2019 0849   TRIG 76 08/05/2019 0849   HDL 41 08/05/2019 0849   CHOLHDL 3.4 08/05/2019 0849   LDLCALC 83 08/05/2019 0849    ECG personally reviewed by me today- *** - No acute changes  EKG 09/14/2019 sinus rhythm nonspecific T wave abnormality 83 bpm  EKG 08/28/2019 Normal sinus rhythm 93 bpm  Coronary CTA 07/27/2019  Coronary calcium score: The patient's coronary artery calcium score is 647, which places the patient in the 99 percentile. Three vessel distribution of coronary calcium.  Coronary arteries: Normal coronary origins. Right dominance.  Right Coronary Artery: Normal caliber vessel, gives rise to PDA. Diffuse proximal mixed calcified and noncalcified plaque with 25-49% stenosis. In mid to distal portion, there is mixed calcified and noncalcified plaque with 1-24% stenosis.  Left Main Coronary Artery: Normal caliber vessel. Small area of calcified plaque with 1-24% stenosis.  Left Anterior Descending Coronary Artery: Normal caliber vessel. Scattered  mixed calcified and noncalcified plaque, most prominent in proximal portion of LAD. There appears to be an area of predominantly noncalcified plaque with 50-69% stenosis proximally. Gives rise to 1 main diagonal branch, which proximally has mixed calcified and noncalcified plaque, unable to visually assess stenosis. Distal LAD wraps apex.  Left Circumflex Artery: Normal caliber vessel. Scattered mixed calcified and noncalcified plaque most prominent in proximal portion. Focal area of stenosis within calcified plaque appears to have 50-69% stenosis, but  higher degree of stenosis cannot be excluded. Gives rise to 1 main OM branch.  Aorta: Normal size, 34 mm at the mid ascending aorta (level of the PA bifurcation) measured double oblique. No calcifications. No dissection.  Aortic Valve: No calcifications. Trileaflet.  FINDINGS: FFRct analysis was performed on the original cardiac CT angiogram dataset. Diagrammatic representation of the FFRct analysis is provided in a separate PDF document in PACS. This dictation was created using the PDF document and an interactive 3D model of the results. 3D model is not available in the EMR/PACS. Normal FFR range is >0.80.  1. Left Main: No significant stenosis. FFR = 0.96  2. LAD: Focal stenosis in proximal LAD, with FFR 0.73. Second focal stenosis in mid LAD, FFR is 0.57 (from vessel origin). 3. LCX: There is a focal proximal stenosis with FFR 0.76. Mid and distal LCX could not be evaluated due to technical limitations. 4. RCA: No significant stenosis. Proximal FFR = 0.95, Mid FFR = 0.89, Distal FFR = 0.85. FFR at terminal RCA is 0.72 but this is a very small caliber section of vessel.  IMPRESSION: 1. CT FFR shows significant stenosis in proximal LAD (0.73), mid LAD (0.57 from origin), and proximal LCX (0.76).   Cardiac catheterization 08/12/2019  Ost LAD to Prox LAD lesion is 90% stenosed.  Mid LAD-1 lesion is 50%  stenosed.  Mid LAD-2 lesion is 90% stenosed.  Ost Cx to Mid Cx lesion is 90% stenosed.  Ost LM to Dist LM lesion is 50% stenosed.  Prox RCA lesion is 30% stenosed.  Prox RCA to Mid RCA lesion is 30% stenosed.  The left ventricular systolic function is normal.  LV end diastolic pressure is normal.  The left ventricular ejection fraction is 55-65% by visual estimate.    Coronary Diagrams  Diagnostic Dominance: Right  Intervention    Assessment & Plan   1.  Status post CABG-no chest pain today.  Continues to stay active and increase his physical activity.  3-vessel 08/24/2019 (LIMA to LAD, left radial artery to OM1, SVG to diagonal).He underwent cardiac catheterization 08/12/2019 which showed moderate left main disease as well as proximal and mid LAD and segmental proximal circumflex within normal left ventricular function. Continue aspirin, clopidogrel, lisinopril, metoprolol, rosuvastatin  Heart healthy low-sodium diet Increase physical activity as tolerated  Essential hypertension-BP today 136/70***.  Well-controlled at home Continue lisinopril, metoprolol tartrate  Heart healthy low-sodium diet-salty 6 given Increase physical activity as tolerated  Hyperlipidemia-08/05/2019: Cholesterol, Total 139; HDL 41; LDL Chol Calc (NIH) 83; Triglycerides 76 Increase rosuvastatin 40 mg at bedtime Heart healthy low-sodium high-fiber diet Repeat lipid panel and LFTs   Disposition: Follow-up with Dr. [name] in 6 months.   Thomasene Ripple. Jak Haggar NP-C    08/07/2020, 7:02 AM [facility] [address] Office [phone] Fax [phone]  Notice: This dictation was prepared with Dragon dictation along with smaller phrase technology. Any transcriptional errors that result from this process are unintentional and may not be corrected upon review.  I spent***minutes examining this patient, reviewing medications, and using patient centered shared  decision making involving her cardiac care.  Prior to her visit I spent greater than 20 minutes reviewing her past medical history,  medications, and prior cardiac tests.

## 2020-08-08 ENCOUNTER — Ambulatory Visit: Payer: BLUE CROSS/BLUE SHIELD | Admitting: General Practice

## 2020-08-23 NOTE — Progress Notes (Signed)
Cardiology Clinic Note   Patient Name: Harry Moore Date of Encounter: 08/24/2020  Primary Care Provider:  [name], MD Primary Cardiologist:  [name], MD  Patient Profile    Dr. Vania Rea. Temme 60 year old male presents today for follow-up of his coronary artery disease status post CABG x3 (08/24/19), essential hypertension, and hyperlipidemia.  Past Medical History    Past Medical History:  Diagnosis Date  . Coronary artery disease   . Diabetes mellitus without complication (HCC)   . Hyperlipidemia   . Hypertension   . Neuromuscular disorder (HCC)    neuropathy in feet  . Pneumonia    Past Surgical History:  Procedure Laterality Date  . CLEFT PALATE REPAIR    . CORONARY ARTERY BYPASS GRAFT N/A 08/24/2019   Procedure: CORONARY ARTERY BYPASS GRAFTING (CABG) times three, using left radial atery harvested endoscopically and right greater saphenous vein harvested endoscopically and left internal mammary artery.;  Surgeon: Kerin Perna, MD;  Location: [facility] OR;  Service: Open Heart Surgery;  Laterality: N/A;  swan only  . LEFT HEART CATH AND CORONARY ANGIOGRAPHY N/A 08/12/2019   Procedure: LEFT HEART CATH AND CORONARY ANGIOGRAPHY;  Surgeon: [name], MD;  Location: [facility] INVASIVE CV LAB;  Service: Cardiovascular;  Laterality: N/A;  . RADIAL ARTERY HARVEST Left 08/24/2019   Procedure: RADIAL ARTERY HARVEST;  Surgeon: Kerin Perna, MD;  Location: [facility] OR;  Service: Open Heart Surgery;  Laterality: Left;  . TEE WITHOUT CARDIOVERSION N/A 08/24/2019   Procedure: TRANSESOPHAGEAL ECHOCARDIOGRAM (TEE);  Surgeon: Donata Clay, Theron Arista, MD;  Location: [facility] OR;  Service: Open Heart Surgery;  Laterality: N/A;    Allergies  Allergies  Allergen Reactions  . Penicillins Rash    Did it involve swelling of the face/tongue/throat, SOB, or low BP? No Did it involve sudden or severe rash/hives, skin peeling, or any reaction on the inside of your mouth or nose?    #  #  YES  #  #  Did  you need to seek medical attention at a hospital or doctor's office? No When did it last happen?childhood reaction.   . 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 [name] that showed stenosis of proximal LAD and OM1, RCA with no significant stenosis.  Normal LVEDP.  His PMH also includes essential hypertension, hyperlipidemia, chest pain of uncertain etiology, and family history of heart disease.   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  He was referred by Dr. [name] for cardiovascular evaluation due to chest discomfort.  He was seen in the office by Dr. [name] 06/01/2019.  He is from [facility].  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  He then retired 4 years ago.  He has had known type 1 diabetes since the age of 19 and now uses an insulin pump.  His father had coronary artery disease and had CABG at age 32.  He was noted to have had chest pain for a year which was exertional in nature but occurred randomly with different activities.  He was seen in the emergency room in [place] and ruled out for MI.  He had a coronary CTA on 07/27/2019 which showed three-vessel CAD,  moderate in circumflex and LAD.  He underwent cardiac catheterization 08/12/2019 which showed moderate left main disease as well as proximal and mid LAD and segmental proximal circumflex within normal left ventricular function.  He was then referred to CVTS for further evaluation.  He underwent CABG x3 08/24/2019.   He contacted cardiac surgery triage on 09/07/2019 and indicated that he had been lightheaded particularly when standing and walking.  His blood pressure at that time was reported at 120/80 and after standing it was 105/60.  His lisinopril was  discontinued, his Lopressor was decreased to 25 mg twice daily and he was encouraged to increase his p.o. hydration.   He presented to the clinic 09/14/2019 for follow-up evaluation and stated he felt well.  He had been walking up and down his driveway,  started cardiac rehab .  They had started him with 6-minute walks and he had been tolerating it well.  He was compliant with sternal precautions.  Left radial, sternal and chest tube sites were clean dry intact open to air.  No drainage.  He contacted cardiac surgery 09/07/2019 with symptoms of lightheadedness and has stopped his lisinopril.  He continued with metoprolol tartrate 50 mg twice daily.  Has had some asymptomatic tachycardia in the a.m. and wished to continue 50 mg dosing.  Had taken rosuvastatin 20 mg for the past 6 months LDL at 83.  I increased his rosuvastatin to 40 mg daily and planned to reevaluate lipid panel and LFTs in 3 months.  Planned follow-up with Dr. [name] in 3 months.  He presents the clinic today for follow-up evaluation states he feels well. He continues to stay physically active playing disc golf. He reports that he has not been as regular with his physical activity and plans to get back on an exercise regimen. He reports that his diet is fairly well controlled and he is switched to mainly grilled meats. He is enjoying retirement and also has a place [facility] which she stays at half the time. 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 I will give him the salty 6 diet sheet, recommend 150 minutes of moderate physical activity per week, requested his lipid panel from his PCP, and have we will have him follow-up in 6 months with Dr. [name].   Today he denies chest pain, shortness of breath, lower extremity edema, fatigue, palpitations,  weakness, presyncope, and syncope.   Home Medications    Prior to Admission medications   Medication Sig Start Date End Date Taking? Authorizing  Provider  aspirin EC 81 MG tablet Take 81 mg by mouth at bedtime.    [provider]  clopidogrel (PLAVIX) 75 MG tablet Take 1 tablet (75 mg total) by mouth daily. 01/27/20   [name], MD  docusate sodium (COLACE) 100 MG capsule Take 100 mg by mouth at bedtime.    [provider]  insulin aspart (NOVOLOG) 100 UNIT/ML injection Inject into the skin. Via Insulin PUMP    [provider]  Insulin Human (INSULIN PUMP) SOLN Inject into the skin. insulin aspart (NOVOLOG) 100 UNIT/ML injection    [provider]  latanoprost (XALATAN) 0.005 % ophthalmic solution Place 1 drop into both eyes at bedtime.     [provider]  lisinopril (ZESTRIL) 20 MG tablet Take 20 mg by mouth at bedtime. 11/25/19   [provider]  metoprolol tartrate (LOPRESSOR) 50 MG tablet Take 1 tablet (50 mg total) by mouth  2 (two) times daily. Due for appt please schedule appt 11/16/19   [name], MD  rosuvastatin (CRESTOR) 40 MG tablet Take 1 tablet (40 mg total) by mouth at bedtime. 11/16/19   [name], MD  timolol (TIMOPTIC) 0.5 % ophthalmic solution Place 1 drop into both eyes daily.    [provider]    Family History    History reviewed. No pertinent family history. has no family status information on file.   Social History    Social History   Socioeconomic History  . Marital status: Single    Spouse name: Not on file  . Number of children: Not on file  . Years of education: Not on file  . Highest education level: Not on file  Occupational History  . Not on file  Tobacco Use  . Smoking status: Never Smoker  . Smokeless tobacco: Never Used  Vaping Use  . Vaping Use: Never used  Substance and Sexual Activity  . Alcohol use: Yes    Comment: 3-4 oz/week vodka liquor  . Drug use: Never  . Sexual activity: Not on file  Other Topics Concern  . Not on file  Social History Narrative  . Not on file   Social Determinants of  Health   Financial Resource Strain: Not on file  Food Insecurity: Not on file  Transportation Needs: Not on file  Physical Activity: Not on file  Stress: Not on file  Social Connections: Not on file  Intimate Partner Violence: Not on file     Review of Systems    General:  No chills, fever, night sweats or weight changes.  Cardiovascular:  No chest pain, dyspnea on exertion, edema, orthopnea, palpitations, paroxysmal nocturnal dyspnea. Dermatological: No rash, lesions/masses Respiratory: No cough, dyspnea Urologic: No hematuria, dysuria Abdominal:   No nausea, vomiting, diarrhea, bright red blood per rectum, melena, or hematemesis Neurologic:  No visual changes, wkns, changes in mental status. All other systems reviewed and are otherwise negative except as noted above.  Physical Exam    VS:  BP (!) 142/78   Pulse 71   Ht  (1.778 m)   Wt 172 lb 3.2 oz (78.1 kg)   SpO2 99%   BMI 24.71 kg/m  , BMI Body mass index is 24.71 kg/m. GEN: Well nourished, well developed, in no acute distress. HEENT: normal. Neck: Supple, no JVD, carotid bruits, or masses. Cardiac: RRR, no murmurs, rubs, or gallops. No clubbing, cyanosis, edema.  Radials/DP/PT 2+ and equal bilaterally.  Respiratory:  Respirations regular and unlabored, clear to auscultation bilaterally. GI: Soft, nontender, nondistended, BS + x 4. MS: no deformity or atrophy. Skin: warm and dry, no rash. Neuro:  Strength and sensation are intact. Psych: Normal affect.  Accessory Clinical Findings    Recent Labs: 08/29/2019: Magnesium 1.9 08/30/2019: BUN 15; Creatinine, Ser 0.94; Hemoglobin 11.3; Platelets 278; Potassium 3.6; Sodium 138 01/03/2020: ALT 19   Recent Lipid Panel    Component Value Date/Time   CHOL 139 08/05/2019 0849   TRIG 76 08/05/2019 0849   HDL 41 08/05/2019 0849   CHOLHDL 3.4 08/05/2019 0849   LDLCALC 83 08/05/2019 0849    ECG personally reviewed by me today-none today.  EKG 09/14/2019 sinus  rhythm nonspecific T wave abnormality 83 bpm   EKG 08/28/2019 Normal sinus rhythm 93 bpm   Coronary CTA 07/27/2019   Coronary calcium score: The patient's coronary artery calcium score is 647, which places the patient in the 99 percentile. Three vessel  distribution of coronary calcium.   Coronary arteries: Normal coronary origins.  Right dominance.   Right Coronary Artery: Normal caliber vessel, gives rise to PDA. Diffuse proximal mixed calcified and noncalcified plaque with 25-49% stenosis. In mid to distal portion, there is mixed calcified and noncalcified plaque with 1-24% stenosis.   Left Main Coronary Artery: Normal caliber vessel. Small area of calcified plaque with 1-24% stenosis.   Left Anterior Descending Coronary Artery: Normal caliber vessel. Scattered mixed calcified and noncalcified plaque, most prominent in proximal portion of LAD. There appears to be an area of predominantly noncalcified plaque with 50-69% stenosis proximally. Gives rise to 1 main diagonal branch, which proximally has mixed calcified and noncalcified plaque, unable to visually assess stenosis. Distal LAD wraps apex.   Left Circumflex Artery: Normal caliber vessel. Scattered mixed calcified and noncalcified plaque most prominent in proximal portion. Focal area of stenosis within calcified plaque appears to have 50-69% stenosis, but higher degree of stenosis cannot be excluded. Gives rise to 1 main OM branch.   Aorta: Normal size, 34 mm at the mid ascending aorta (level of the PA bifurcation) measured double oblique. No calcifications. No dissection.   Aortic Valve: No calcifications. Trileaflet.   FINDINGS: FFRct analysis was performed on the original cardiac CT angiogram dataset. Diagrammatic representation of the FFRct analysis is provided in a separate PDF document in PACS. This dictation was created using the PDF document and an interactive 3D model of the results. 3D model is not  available in the EMR/PACS. Normal FFR range is >0.80.   1. Left Main:  No significant stenosis. FFR = 0.96   2. LAD: Focal stenosis in proximal LAD, with FFR 0.73. Second focal stenosis in mid LAD, FFR is 0.57 (from vessel origin). 3. LCX: There is a focal proximal stenosis with FFR 0.76. Mid and distal LCX could not be evaluated due to technical limitations. 4. RCA: No significant stenosis. Proximal FFR = 0.95, Mid FFR = 0.89, Distal FFR = 0.85. FFR at terminal RCA is 0.72 but this is a very small caliber section of vessel.   IMPRESSION: 1. CT FFR shows significant stenosis in proximal LAD (0.73), mid LAD (0.57 from origin), and proximal LCX (0.76).     Cardiac catheterization 08/12/2019  Ost LAD to Prox LAD lesion is 90% stenosed.  Mid LAD-1 lesion is 50% stenosed.  Mid LAD-2 lesion is 90% stenosed.  Ost Cx to Mid Cx lesion is 90% stenosed.  Ost LM to Dist LM lesion is 50% stenosed.  Prox RCA lesion is 30% stenosed.  Prox RCA to Mid RCA lesion is 30% stenosed.  The left ventricular systolic function is normal.  LV end diastolic pressure is normal.  The left ventricular ejection fraction is 55-65% by visual estimate.   Diagnostic Dominance: Right    Intervention    Assessment & Plan   1.  Coronary artery disease status post CABG-no chest pain today.  Continues to stay active and increase his physical activity.  3-vessel 08/24/2019 (LIMA to LAD, left radial artery to OM1, SVG to diagonal).He underwent cardiac catheterization 08/12/2019 which showed moderate left main disease as well as proximal and mid LAD and segmental proximal circumflex within normal left ventricular function. Continue aspirin, clopidogrel, lisinopril, metoprolol, rosuvastatin  Heart healthy low-sodium diet Increase physical activity as tolerated   Essential hypertension-BP today 142/78.  Well-controlled at home Continue lisinopril, metoprolol tartrate  Heart healthy low-sodium diet-salty 6  given Increase physical activity as tolerated  Hyperlipidemia-08/05/2019: Cholesterol, Total 139; HDL  41; LDL Chol Calc (NIH) 83; Triglycerides 76 Increase rosuvastatin 40 mg at bedtime Heart healthy low-sodium high-fiber diet Per pcp   Disposition: Follow-up with Dr. [name] in 6 months.   Thomasene Ripple. Elhadj Girton NP-C    08/24/2020, 9:01 AM [facility] [address] Office [phone] Fax [phone]  Notice: This dictation was prepared with Dragon dictation along with smaller phrase technology. Any transcriptional errors that result from this process are unintentional and may not be corrected upon review.  I spent 12 minutes examining this patient, reviewing medications, and using patient centered shared decision making involving her cardiac care.  Prior to her visit I spent greater than 20 minutes reviewing her past medical history,  medications, and prior cardiac tests.

## 2020-08-24 ENCOUNTER — Other Ambulatory Visit: Payer: Self-pay

## 2020-08-24 ENCOUNTER — Ambulatory Visit (INDEPENDENT_AMBULATORY_CARE_PROVIDER_SITE_OTHER): Payer: BLUE CROSS/BLUE SHIELD | Admitting: General Practice

## 2020-08-24 ENCOUNTER — Encounter: Payer: Self-pay | Admitting: General Practice

## 2020-08-24 VITALS — BP 142/78 | HR 71 | Ht 70.0 in | Wt 172.2 lb

## 2020-08-24 DIAGNOSIS — E782 Mixed hyperlipidemia: Secondary | ICD-10-CM | POA: Diagnosis not present

## 2020-08-24 DIAGNOSIS — Z951 Presence of aortocoronary bypass graft: Secondary | ICD-10-CM

## 2020-08-24 DIAGNOSIS — I10 Essential (primary) hypertension: Secondary | ICD-10-CM

## 2020-08-24 NOTE — Patient Instructions (Signed)
Medication Instructions:  The current medical regimen is effective;  continue present plan and medications as directed. Please refer to the Current Medication list given to you today.  *If you need a refill on your cardiac medications before your next appointment, please call your pharmacy*  Lab Work:   Testing/Procedures:  NONE    NONE  Special Instructions HAVE PRIMARY CARE DO LIPID PANEL FAX TO [phone]  PLEASE READ AND FOLLOW SALTY 6-ATTACHED-1,800mg  daily  PLEASE INCREASE PHYSICAL ACTIVITY AS TOLERATED-MODERATE ACTIVITY GOAL 150 MIN/WEEK  Follow-Up: Your next appointment:  6 month(s) In Person with [name], MD OR IF UNAVAILABLE JESSE CLEAVER, FNP-C  At [facility], you and your health needs are our priority.  As part of our continuing mission to provide you with exceptional heart care, we have created designated Provider Care Teams.  These Care Teams include your primary Cardiologist (physician) and Advanced Practice Providers (APPs -  Physician Assistants and Nurse Practitioners) who all work together to provide you with the care you need, when you need it.            6 SALTY THINGS TO AVOID     1,800MG  DAILY

## 2020-08-29 ENCOUNTER — Telehealth: Payer: Self-pay

## 2020-08-29 NOTE — Telephone Encounter (Signed)
-----   Message from Ronney Asters, NP sent at 08/29/2020  7:18 AM EST ----- Please contact Mr. Ostrow and let him know that Dr. [name] and I have discussed his Plavix.  He may discontinue his Plavix at this time and continue his aspirin.  Thank you. 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 (coronary artery disease status post CABG x3 (08/24/19)  He continues to do well, stay physically active, and has had no further episodes of chest discomfort.  I informed him that I would ask for your recommendations.  Thank you,  Verdon Cummins

## 2020-08-29 NOTE — Telephone Encounter (Signed)
Pt informed of providers recommendations. Pt verbalized understanding. No further questions .  

## 2020-12-25 ENCOUNTER — Other Ambulatory Visit: Payer: Self-pay | Admitting: Cardiovascular Disease

## 2021-05-05 IMAGING — CT CT HEART MORP W/ CTA COR W/ SCORE W/ CA W/CM &/OR W/O CM
4 of 7 series · 8 of 20 positions shown, 9 images · IV contrast (APPLIED)
Comparison: None.
COMPARISON: None.
COMPARISON: None.

Addendum:
EXAM:
OVER-READ INTERPRETATION  CT CHEST

The following report is an over-read performed by radiologist Dr.
Cachorro Snodgrass [REDACTED] on 07/27/2019. This
over-read does not include interpretation of cardiac or coronary
anatomy or pathology. The coronary CTA interpretation by the
cardiologist is attached.
HISTORY: SOB, abn biomarkers, heart failure suspected Chest pain
Cardiac/Coronary CT
TECHNIQUE: The patient was scanned on a Siemens Force scanner.
PROTOCOL: A 120 kV prospective scan was triggered in the descending thoracic
aorta at 111 HU's. Axial non-contrast 3 mm slices were carried out
through the heart. The data set was analyzed on a dedicated work
station and scored using the Agatson method. Gantry rotation speed
was 250 msecs and collimation was 0.6 mm. Beta blockade, calcium
channel blocker and 0.8 mg of sl NTG was given. The 3D data set was
reconstructed in 5% intervals of 35-75% of the R-R cycle. Diastolic
phases were analyzed on a dedicated work station using MPR, MIP and
VRT modes. The patient received 80mL OMNIPAQUE IOHEXOL 350 MG/ML
SOLN of contrast. Heart rate 74 bpm despite beta and calcium channel
blocker.
CLINICAL DATA: SOB, abn biomarkers, heart failure suspected Chest
pain

[Series 6: best diast 71 % · axial · 0.39mm/px · z∈[-172,-127]mm · 2 of 338 slices shown]
[im 113/338  vessel]
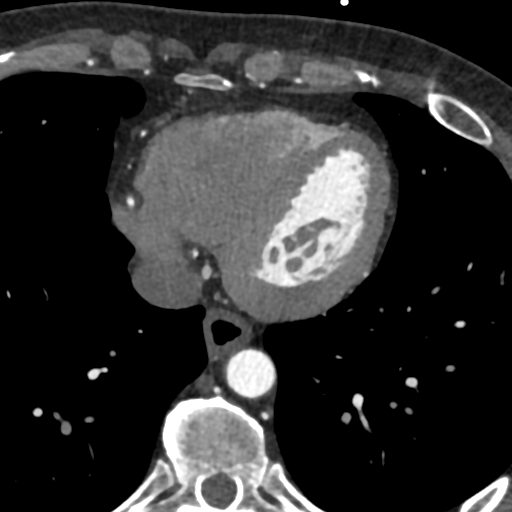
[im 225/338  vessel]
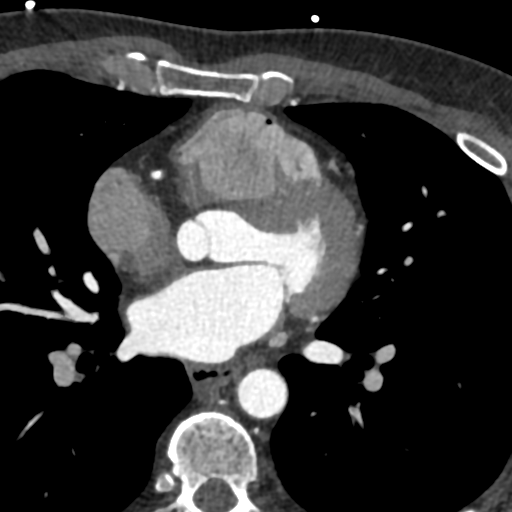

[Series 7: best syst · axial · 0.39mm/px · z∈[-172,-127]mm · 2 of 338 slices shown, 3 images]
[im 113/338  vessel]
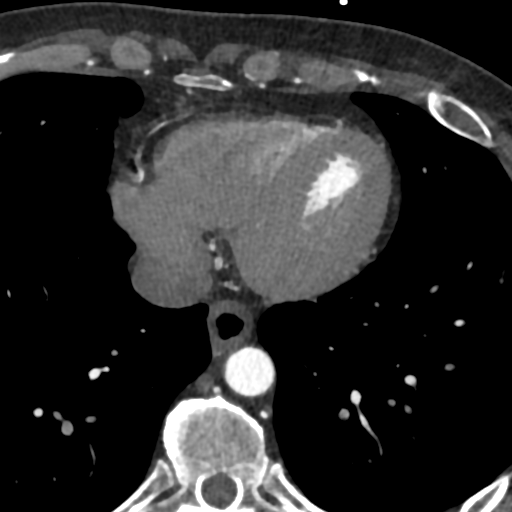
[im 113/338  lung]
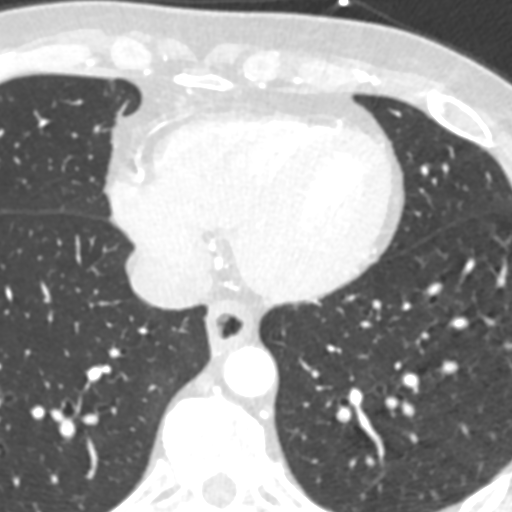
[im 225/338  vessel]
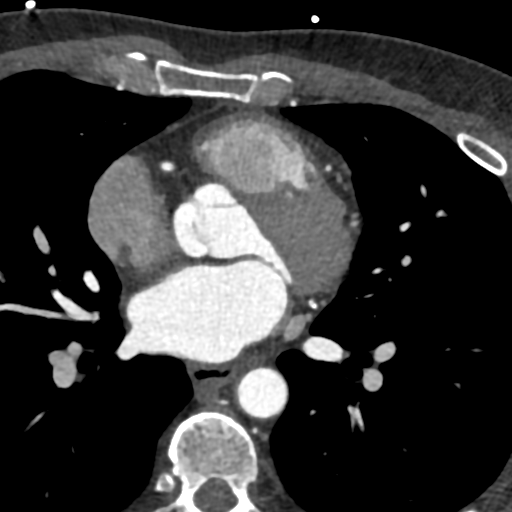

[Series 8: ts diast sharp · axial · 0.39mm/px · z∈[-172,-127]mm · 2 of 338 slices shown]
[im 113/338  lung]
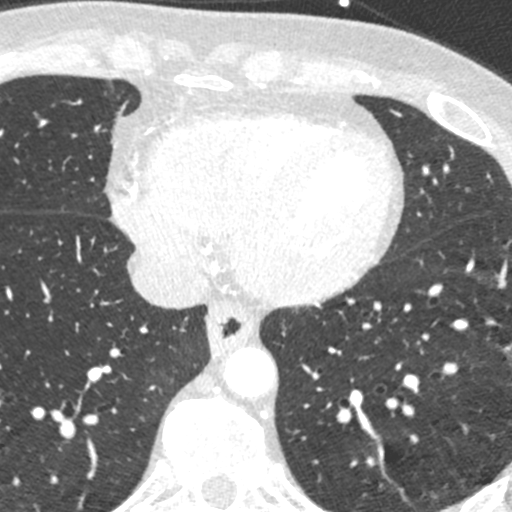
[im 225/338  lung]
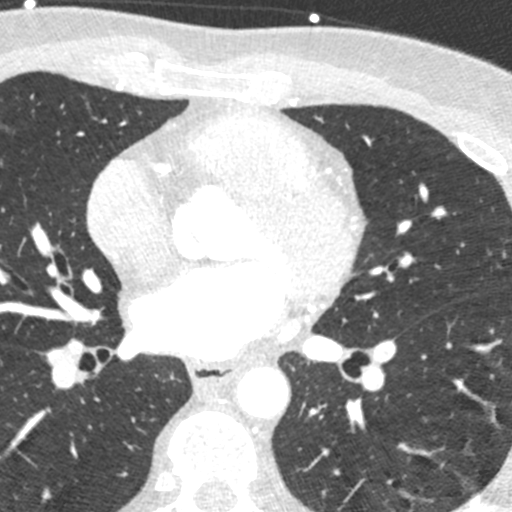

[Series 9: ts syst sharp · axial · 0.39mm/px · z∈[-172,-127]mm · 2 of 338 slices shown]
[im 113/338  lung]
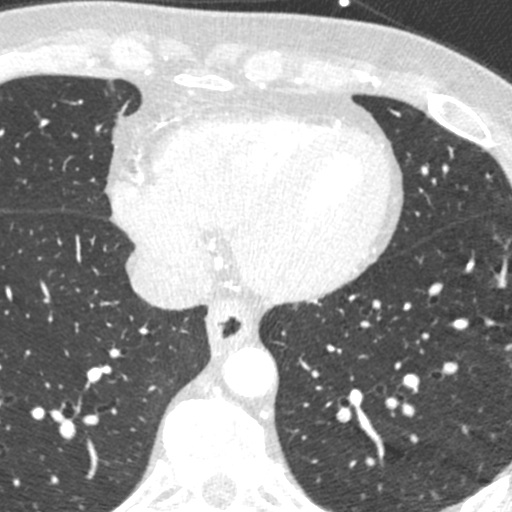
[im 225/338  lung]
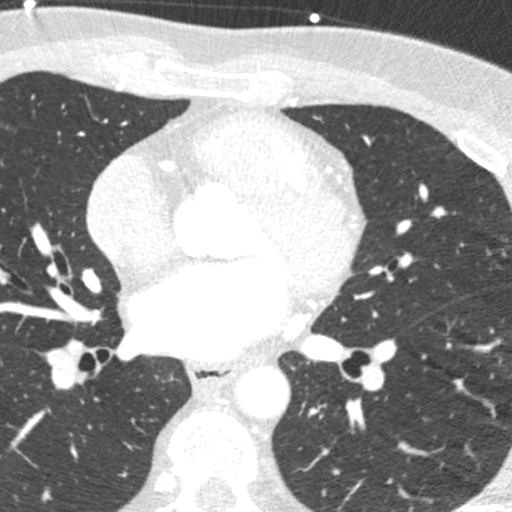

[8 of 20 positions shown; findings below may reference images not displayed]

FINDINGS: Limited view of the lung parenchyma demonstrates no suspicious
nodularity. Airways are normal.

Limited view of the mediastinum demonstrates no adenopathy.
Esophagus normal.

Limited view of the upper abdomen unremarkable.

Limited view of the skeleton and chest wall is unremarkable.
IMPRESSION: No significant extracardiac findings.
FINDINGS: Coronary calcium score: The patient's coronary artery calcium score
is 647, which places the patient in the 99 percentile. Three vessel
distribution of coronary calcium.

Coronary arteries: Normal coronary origins.  Right dominance.

Right Coronary Artery: Normal caliber vessel, gives rise to PDA.
Diffuse proximal mixed calcified and noncalcified plaque with 25-49%
stenosis. In mid to distal portion, there is mixed calcified and
noncalcified plaque with 1-24% stenosis.

Left Main Coronary Artery: Normal caliber vessel. Small area of
calcified plaque with 1-24% stenosis.

Left Anterior Descending Coronary Artery: Normal caliber vessel.
Scattered mixed calcified and noncalcified plaque, most prominent in
proximal portion of LAD. There appears to be an area of
predominantly noncalcified plaque with 50-69% stenosis proximally.
Gives rise to 1 main diagonal branch, which proximally has mixed
calcified and noncalcified plaque, unable to visually assess
stenosis. Distal LAD wraps apex.

Left Circumflex Artery: Normal caliber vessel. Scattered mixed
calcified and noncalcified plaque most prominent in proximal
portion. Focal area of stenosis within calcified plaque appears to
have 50-69% stenosis, but higher degree of stenosis cannot be
excluded. Gives rise to 1 main OM branch.

Aorta: Normal size, 34 mm at the mid ascending aorta (level of the
PA bifurcation) measured double oblique. No calcifications. No
dissection.

Aortic Valve: No calcifications. Trileaflet.

Other findings:

Normal pulmonary vein drainage into the left atrium. Left system has
one common trunk draining into left atrium from both left upper and
left lower pulmonary veins.

Normal left atrial appendage without a thrombus.

Normal size of the pulmonary artery.
IMPRESSION: 1. Moderate obstructive CAD, CADRADS = 3. Mixed calcified and
noncalcified plaque seen in the proximal portions of LAD, LCx, and
RCA. CT FFR will be performed and reported separately.

2. Coronary calcium score of 647. This was 99th percentile for age
and sex matched control.

3. Normal coronary origin with right dominance.

EXAM:
CT FFR ANALYSIS
FINDINGS: FFRct analysis was performed on the original cardiac CT angiogram
dataset. Diagrammatic representation of the FFRct analysis is
provided in a separate PDF document in PACS. This dictation was
created using the PDF document and an interactive 3D model of the
results. 3D model is not available in the EMR/PACS. Normal FFR range
is >0.80.

1. Left Main:  No significant stenosis. FFR =

2. LAD: Focal stenosis in proximal LAD, with FFR 0.73. Second focal
stenosis in mid LAD, FFR is 0.57 (from vessel origin).
3. LCX: There is a focal proximal stenosis with FFR 0.76. Mid and
distal LCX could not be evaluated due to technical limitations.
4. RCA: No significant stenosis. Proximal FFR = 0.95, Mid FFR =
0.89, Distal FFR = 0.85. FFR at terminal RCA is 0.72 but this is a
very small caliber section of vessel.
IMPRESSION: 1. CT FFR shows significant stenosis in proximal LAD (0.73), mid LAD
(0.57 from origin), and proximal LCX (0.76).

*** End of Addendum ***
Addendum:
EXAM:
OVER-READ INTERPRETATION  CT CHEST

The following report is an over-read performed by radiologist Dr.
Cachorro Snodgrass [REDACTED] on 07/27/2019. This
over-read does not include interpretation of cardiac or coronary
anatomy or pathology. The coronary CTA interpretation by the
cardiologist is attached.
FINDINGS: Limited view of the lung parenchyma demonstrates no suspicious
nodularity. Airways are normal.

Limited view of the mediastinum demonstrates no adenopathy.
Esophagus normal.

Limited view of the upper abdomen unremarkable.

Limited view of the skeleton and chest wall is unremarkable.
IMPRESSION: No significant extracardiac findings.
FINDINGS: Coronary calcium score: The patient's coronary artery calcium score
is 647, which places the patient in the 99 percentile. Three vessel
distribution of coronary calcium.

Coronary arteries: Normal coronary origins.  Right dominance.

Right Coronary Artery: Normal caliber vessel, gives rise to PDA.
Diffuse proximal mixed calcified and noncalcified plaque with 25-49%
stenosis. In mid to distal portion, there is mixed calcified and
noncalcified plaque with 1-24% stenosis.

Left Main Coronary Artery: Normal caliber vessel. Small area of
calcified plaque with 1-24% stenosis.

Left Anterior Descending Coronary Artery: Normal caliber vessel.
Scattered mixed calcified and noncalcified plaque, most prominent in
proximal portion of LAD. There appears to be an area of
predominantly noncalcified plaque with 50-69% stenosis proximally.
Gives rise to 1 main diagonal branch, which proximally has mixed
calcified and noncalcified plaque, unable to visually assess
stenosis. Distal LAD wraps apex.

Left Circumflex Artery: Normal caliber vessel. Scattered mixed
calcified and noncalcified plaque most prominent in proximal
portion. Focal area of stenosis within calcified plaque appears to
have 50-69% stenosis, but higher degree of stenosis cannot be
excluded. Gives rise to 1 main OM branch.

Aorta: Normal size, 34 mm at the mid ascending aorta (level of the
PA bifurcation) measured double oblique. No calcifications. No
dissection.

Aortic Valve: No calcifications. Trileaflet.

Other findings:

Normal pulmonary vein drainage into the left atrium. Left system has
one common trunk draining into left atrium from both left upper and
left lower pulmonary veins.

Normal left atrial appendage without a thrombus.

Normal size of the pulmonary artery.
IMPRESSION: 1. Moderate obstructive CAD, CADRADS = 3. Mixed calcified and
noncalcified plaque seen in the proximal portions of LAD, LCx, and
RCA. CT FFR will be performed and reported separately.

2. Coronary calcium score of 647. This was 99th percentile for age
and sex matched control.

3. Normal coronary origin with right dominance.

*** End of Addendum ***
EXAM:
OVER-READ INTERPRETATION  CT CHEST

The following report is an over-read performed by radiologist Dr.
Cachorro Snodgrass [REDACTED] on 07/27/2019. This
over-read does not include interpretation of cardiac or coronary
anatomy or pathology. The coronary CTA interpretation by the
cardiologist is attached.
FINDINGS: Limited view of the lung parenchyma demonstrates no suspicious
nodularity. Airways are normal.

Limited view of the mediastinum demonstrates no adenopathy.
Esophagus normal.

Limited view of the upper abdomen unremarkable.

Limited view of the skeleton and chest wall is unremarkable.
IMPRESSION: No significant extracardiac findings.

## 2021-08-12 ENCOUNTER — Other Ambulatory Visit: Payer: Self-pay | Admitting: Cardiovascular Disease

## 2021-12-11 ENCOUNTER — Other Ambulatory Visit: Payer: Self-pay | Admitting: Cardiovascular Disease

## 2022-02-10 ENCOUNTER — Other Ambulatory Visit: Payer: Self-pay | Admitting: Cardiovascular Disease
